# Patient Record
Sex: Male | Born: 1990 | Race: White | Hispanic: No | Marital: Single | Smoking: Current every day smoker
Health system: Southern US, Community
[De-identification: ages and names within clinical notes are randomized; demographics above are authoritative.]

## PROBLEM LIST (undated history)

## (undated) DIAGNOSIS — F410 Panic disorder [episodic paroxysmal anxiety] without agoraphobia: Secondary | ICD-10-CM

## (undated) DIAGNOSIS — Z8709 Personal history of other diseases of the respiratory system: Secondary | ICD-10-CM

## (undated) DIAGNOSIS — IMO0001 Reserved for inherently not codable concepts without codable children: Secondary | ICD-10-CM

## (undated) DIAGNOSIS — Z8701 Personal history of pneumonia (recurrent): Secondary | ICD-10-CM

## (undated) DIAGNOSIS — F419 Anxiety disorder, unspecified: Secondary | ICD-10-CM

## (undated) DIAGNOSIS — G43909 Migraine, unspecified, not intractable, without status migrainosus: Secondary | ICD-10-CM

## (undated) DIAGNOSIS — R51 Headache: Secondary | ICD-10-CM

## (undated) DIAGNOSIS — T07XXXA Unspecified multiple injuries, initial encounter: Secondary | ICD-10-CM

## (undated) DIAGNOSIS — G8929 Other chronic pain: Secondary | ICD-10-CM

## (undated) DIAGNOSIS — R519 Headache, unspecified: Secondary | ICD-10-CM

## (undated) DIAGNOSIS — M549 Dorsalgia, unspecified: Secondary | ICD-10-CM

## (undated) DIAGNOSIS — K219 Gastro-esophageal reflux disease without esophagitis: Secondary | ICD-10-CM

## (undated) HISTORY — PX: BACK SURGERY: SHX140

## (undated) HISTORY — PX: TONSILLECTOMY: SUR1361

## (undated) HISTORY — PX: TYMPANOSTOMY TUBE PLACEMENT: SHX32

## (undated) HISTORY — DX: Anxiety disorder, unspecified: F41.9

---
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
## 2011-02-15 LAB — URINALYSIS, ROUTINE W REFLEX MICROSCOPIC
Bilirubin Urine: NEGATIVE
Glucose, UA: NEGATIVE mg/dL
Hgb urine dipstick: NEGATIVE
Ketones, ur: NEGATIVE mg/dL
Protein, ur: NEGATIVE mg/dL

## 2011-02-15 LAB — COMPREHENSIVE METABOLIC PANEL
Albumin: 3.9 g/dL (ref 3.5–5.2)
BUN: 8 mg/dL (ref 6–23)
Chloride: 104 mEq/L (ref 96–112)
Creatinine, Ser: 1.13 mg/dL (ref 0.4–1.5)
Glucose, Bld: 110 mg/dL — ABNORMAL HIGH (ref 70–99)
Total Bilirubin: 0.7 mg/dL (ref 0.3–1.2)
Total Protein: 7 g/dL (ref 6.0–8.3)

## 2011-02-15 LAB — TSH: TSH: 0.615 u[IU]/mL (ref 0.350–4.500)

## 2011-02-15 LAB — DRUGS OF ABUSE SCREEN W/O ALC, ROUTINE URINE
Barbiturate Quant, Ur: NEGATIVE
Benzodiazepines.: NEGATIVE
Cocaine Metabolites: NEGATIVE
Methadone: NEGATIVE
Phencyclidine (PCP): NEGATIVE

## 2011-02-15 LAB — CBC
HCT: 39.4 % (ref 39.0–52.0)
Hemoglobin: 13.1 g/dL (ref 13.0–17.0)
MCV: 83.9 fL (ref 78.0–100.0)
Platelets: 275 10*3/uL (ref 150–400)
RDW: 15.1 % (ref 11.5–15.5)
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
## 2012-05-08 ENCOUNTER — Ambulatory Visit (INDEPENDENT_AMBULATORY_CARE_PROVIDER_SITE_OTHER): Payer: Managed Care, Other (non HMO) | Admitting: Family Medicine

## 2012-05-08 VITALS — BP 132/92 | HR 115 | Temp 98.7°F | Resp 18 | Ht 76.18 in | Wt 250.4 lb

## 2012-05-08 DIAGNOSIS — F410 Panic disorder [episodic paroxysmal anxiety] without agoraphobia: Secondary | ICD-10-CM

## 2012-05-08 DIAGNOSIS — G4700 Insomnia, unspecified: Secondary | ICD-10-CM

## 2012-05-08 DIAGNOSIS — E039 Hypothyroidism, unspecified: Secondary | ICD-10-CM

## 2012-05-08 MED ORDER — ZOLPIDEM TARTRATE 10 MG PO TABS: 10.0000 mg | ORAL_TABLET | Freq: Every evening | ORAL | Status: DC | PRN

## 2012-05-08 MED ORDER — ALPRAZOLAM 1 MG PO TABS: 1.0000 mg | ORAL_TABLET | Freq: Three times a day (TID) | ORAL | Status: DC | PRN

## 2012-05-08 NOTE — Progress Notes (Signed)
[facility]:  Office Visit  Chief Complaint:  Chief Complaint  Patient presents with  . Panic Attack    years,   has been seeing dr. Odette Fraction  last took klonopin 2mg   did not help    HPI: Daniel [name] is a 21 y.o. male who complains of: Anxiety attack that started last night, he does not know what triggered it. He has had anxiety issues for years. Patient was on Clonazepam 2 mg TID but last dose was 6 days ago. He has no more meds. He sees Dr. Otelia Santee. States that med does not help him with his anxiety. LAst saw Dr. Otelia Santee 2-3 months ago. He denies being suicidal or homicidal. He does have some paranoia, chronic issue, sonmeone is watching him. No hallucinations. He lives with his parents. He has a prior history of hypothyroid was on thyroid medicationbut stopped several years earlier. He was on growth hormones for several years as an adolescent.   Past Medical History  Diagnosis Date  . Anxiety    History reviewed. No pertinent past surgical history. History   Social History  . Marital Status: Single    Spouse Name: N/A    Number of Children: N/A  . Years of Education: N/A   Social History Main Topics  . Smoking status: Current Everyday Smoker  . Smokeless tobacco: None  . Alcohol Use: Yes  . Drug Use: No  . Sexually Active: None   Other Topics Concern  . None   Social History Narrative  . None   Family History  Problem Relation Age of Onset  . Hyperlipidemia Father    No Known Allergies Prior to Admission medications   Not on File     ROS: The patient denies fevers, chills, night sweats, unintentional weight loss, chest pain, palpitations, wheezing, dyspnea on exertion, nausea, vomiting, abdominal pain, dysuria, hematuria, melena, numbness, weakness, or tingling.  All other systems have been reviewed and were otherwise negative with the exception of those mentioned in the HPI and as above.    PHYSICAL EXAM: Filed Vitals:   05/08/12 1659  BP: 132/92  Pulse: 115  Temp: 98.7 F (37.1 C)  Resp: 18   Filed Vitals:   05/08/12 1659  Height: 6' 4.18" (1.935 m)  Weight: 250 lb 6.4 oz (113.581 kg)   Body mass index is 30.34 kg/(m^2).  General: Alert, anxious. Mouth twitching HEENT:  Normocephalic, atraumatic, oropharynx patent.  Cardiovascular:  Regular rate and rhythm, no rubs murmurs or gallops.  No Carotid bruits, radial pulse intact. No pedal edema.  Respiratory: Clear to auscultation bilaterally.  No wheezes, rales, or rhonchi.  No cyanosis, no use of accessory musculature GI: No organomegaly, abdomen is soft and non-tender, positive bowel sounds.  No masses. Skin: No rashes. Neurologic: Facial musculature symmetric. Psychiatric: Patient is appropriate throughout our interaction. Lymphatic: No cervical lymphadenopathy. Musculoskeletal: Gait intact.   LABS:    EKG/XRAY:   Primary read interpreted by Dr[facility] Conley Rolls at [facility].   ASSESSMENT/PLAN: Encounter Diagnoses  Name Primary?  Marland Kitchen Anxiety attack Yes  . Hypothyroidism     Attempted to call Dr. Otelia Santee to expedite appt. Will try in AM. I have advised patient to go to ER if having thoughts of SI/HI or worsening sxs.  Patient doe snot think that Clonazepam is helping him would like to try something new. He would like help with sleeping as well since has not slept in 6 hours.  Will prescribe Xanax 1 mg tablets  PO TID , #15 and Ambien 10 mg # 15  Patient will try to get back to Dr. Otelia Santee ASAP, no refills from [place]. Check TSH for prior h/o thyroid supplementation.     Daniel Capri PHUONG, DO 05/08/2012 5:35 PM

## 2012-05-09 ENCOUNTER — Telehealth: Payer: Self-pay | Admitting: Family Medicine

## 2012-05-09 LAB — TSH: TSH: 1.808 u[IU]/mL (ref 0.350–4.500)

## 2012-05-09 NOTE — Telephone Encounter (Signed)
Attempted to call patient unbale to leavemessage because voicemail not set up. Able to make appt for him with Dr. Odessa Fleming on Monday @ 2:25 pm. If he has any other problems before then he can call his nurse Mary/Dottie at [phone]. Daniel [name] is only in office on MTuesW, 1-4 pm. This was the best I could do.

## 2012-05-09 NOTE — Telephone Encounter (Signed)
Pt had tried to [facility] but didn't get to speak w/anyone. Called pt back and he reported that the xanax/ambien combo is working very well for him. It works but doesn't make him sleepy and he can still function. He would like to continue this but only has enough xanax for 5 days. He would rather not have to pay another co-pay for just a couple of days and then have Dr Otelia Santee Rx it and have to pay the co-pay again, even though it is not very exp. Can we go ahead and Rx the xanax for a month? He has enough Ambien to last until after he sees Dr Otelia Santee.

## 2012-05-10 ENCOUNTER — Telehealth: Payer: Self-pay | Admitting: Family Medicine

## 2012-05-10 ENCOUNTER — Other Ambulatory Visit: Payer: Self-pay | Admitting: Family Medicine

## 2012-05-10 DIAGNOSIS — F410 Panic disorder [episodic paroxysmal anxiety] without agoraphobia: Secondary | ICD-10-CM

## 2012-05-10 MED ORDER — ALPRAZOLAM 1 MG PO TABS: 1.0000 mg | ORAL_TABLET | Freq: Three times a day (TID) | ORAL | Status: AC | PRN

## 2012-05-10 NOTE — Telephone Encounter (Signed)
Spoke with Daniel [name] abouthis appt with Otelia Santee. He will go on MOnday. His combo meds of Xanax and Remus Loffler is working well. He is able to sleep and function without drowsiness.  I will give him only #15 more to cover up through Monday and a little beyond. He does not need ambien. He and I discussed the addictive properties of the medicine and he knows that our office will not refill his Xanax after this. He needs to go see Dr. Otelia Santee. Patient agrees.

## 2014-07-18 ENCOUNTER — Other Ambulatory Visit (HOSPITAL_COMMUNITY): Payer: Self-pay | Admitting: Sports Medicine

## 2014-07-18 DIAGNOSIS — S32010A Wedge compression fracture of first lumbar vertebra, initial encounter for closed fracture: Secondary | ICD-10-CM

## 2014-07-18 DIAGNOSIS — W19XXXA Unspecified fall, initial encounter: Secondary | ICD-10-CM

## 2014-07-18 DIAGNOSIS — S32030A Wedge compression fracture of third lumbar vertebra, initial encounter for closed fracture: Secondary | ICD-10-CM

## 2014-07-18 DIAGNOSIS — S32020A Wedge compression fracture of second lumbar vertebra, initial encounter for closed fracture: Secondary | ICD-10-CM

## 2014-07-23 ENCOUNTER — Other Ambulatory Visit: Payer: Self-pay | Admitting: Physical Medicine and Rehabilitation

## 2014-07-23 DIAGNOSIS — M545 Low back pain, unspecified: Secondary | ICD-10-CM

## 2014-07-24 ENCOUNTER — Ambulatory Visit (HOSPITAL_COMMUNITY): Admission: RE | Admit: 2014-07-24 | Payer: Managed Care, Other (non HMO) | Source: Ambulatory Visit

## 2014-08-02 ENCOUNTER — Ambulatory Visit
Admission: RE | Admit: 2014-08-02 | Discharge: 2014-08-02 | Disposition: A | Discharge status: Home / Self Care | Payer: Managed Care, Other (non HMO) | Source: Ambulatory Visit | Attending: Orthopedic Surgery | Admitting: Orthopedic Surgery

## 2014-08-02 ENCOUNTER — Other Ambulatory Visit: Payer: Self-pay | Admitting: Orthopedic Surgery

## 2014-08-02 DIAGNOSIS — S32000D Wedge compression fracture of unspecified lumbar vertebra, subsequent encounter for fracture with routine healing: Secondary | ICD-10-CM

## 2014-08-05 ENCOUNTER — Encounter (HOSPITAL_COMMUNITY): Payer: Self-pay | Admitting: General Practice

## 2014-08-05 ENCOUNTER — Inpatient Hospital Stay (HOSPITAL_COMMUNITY)
Admission: AD | Admit: 2014-08-05 | Discharge: 2014-08-09 | DRG: 460 | Disposition: A | Discharge status: Home / Self Care | Payer: Managed Care, Other (non HMO) | Source: Ambulatory Visit | Attending: Orthopedic Surgery | Admitting: Orthopedic Surgery

## 2014-08-05 DIAGNOSIS — Z9119 Patient's noncompliance with other medical treatment and regimen: Secondary | ICD-10-CM | POA: Diagnosis present

## 2014-08-05 DIAGNOSIS — F419 Anxiety disorder, unspecified: Secondary | ICD-10-CM | POA: Diagnosis present

## 2014-08-05 DIAGNOSIS — Z87891 Personal history of nicotine dependence: Secondary | ICD-10-CM

## 2014-08-05 DIAGNOSIS — S32028A Other fracture of second lumbar vertebra, initial encounter for closed fracture: Principal | ICD-10-CM | POA: Diagnosis present

## 2014-08-05 DIAGNOSIS — S32000A Wedge compression fracture of unspecified lumbar vertebra, initial encounter for closed fracture: Secondary | ICD-10-CM | POA: Diagnosis present

## 2014-08-05 DIAGNOSIS — M4326 Fusion of spine, lumbar region: Secondary | ICD-10-CM

## 2014-08-05 DIAGNOSIS — E780 Pure hypercholesterolemia: Secondary | ICD-10-CM | POA: Diagnosis present

## 2014-08-05 DIAGNOSIS — G8918 Other acute postprocedural pain: Secondary | ICD-10-CM | POA: Diagnosis not present

## 2014-08-05 DIAGNOSIS — IMO0002 Reserved for concepts with insufficient information to code with codable children: Secondary | ICD-10-CM

## 2014-08-05 DIAGNOSIS — S32038A Other fracture of third lumbar vertebra, initial encounter for closed fracture: Secondary | ICD-10-CM | POA: Diagnosis present

## 2014-08-05 DIAGNOSIS — W19XXXA Unspecified fall, initial encounter: Secondary | ICD-10-CM | POA: Diagnosis present

## 2014-08-05 DIAGNOSIS — M545 Low back pain: Secondary | ICD-10-CM | POA: Diagnosis present

## 2014-08-05 HISTORY — DX: Unspecified multiple injuries, initial encounter: T07.XXXA

## 2014-08-05 LAB — URINALYSIS, ROUTINE W REFLEX MICROSCOPIC
BILIRUBIN URINE: NEGATIVE
Glucose, UA: NEGATIVE mg/dL
Hgb urine dipstick: NEGATIVE
Ketones, ur: NEGATIVE mg/dL
Leukocytes, UA: NEGATIVE
NITRITE: NEGATIVE
PROTEIN: NEGATIVE mg/dL
SPECIFIC GRAVITY, URINE: 1.015 (ref 1.005–1.030)
UROBILINOGEN UA: 0.2 mg/dL (ref 0.0–1.0)
pH: 5.5 (ref 5.0–8.0)

## 2014-08-05 LAB — COMPREHENSIVE METABOLIC PANEL
ALBUMIN: 4.1 g/dL (ref 3.5–5.2)
ALK PHOS: 97 U/L (ref 39–117)
ALT: 17 U/L (ref 0–53)
AST: 44 U/L — ABNORMAL HIGH (ref 0–37)
Anion gap: 12 (ref 5–15)
BUN: 10 mg/dL (ref 6–23)
CO2: 26 mEq/L (ref 19–32)
CREATININE: 1.37 mg/dL — AB (ref 0.50–1.35)
Calcium: 9.1 mg/dL (ref 8.4–10.5)
Chloride: 101 mEq/L (ref 96–112)
GFR calc non Af Amer: 71 mL/min — ABNORMAL LOW (ref >90)
GFR, EST AFRICAN AMERICAN: 82 mL/min — AB (ref >90)
GLUCOSE: 88 mg/dL (ref 70–99)
Potassium: 4.1 mEq/L (ref 3.7–5.3)
Sodium: 139 mEq/L (ref 137–147)
TOTAL PROTEIN: 7.4 g/dL (ref 6.0–8.3)
Total Bilirubin: <0.2 mg/dL — ABNORMAL LOW (ref 0.3–1.2)

## 2014-08-05 LAB — RAPID URINE DRUG SCREEN, HOSP PERFORMED
Amphetamines: NOT DETECTED
Barbiturates: NOT DETECTED
Benzodiazepines: POSITIVE — AB
Cocaine: NOT DETECTED
Opiates: NOT DETECTED
Tetrahydrocannabinol: NOT DETECTED

## 2014-08-05 LAB — ETHANOL: Alcohol, Ethyl (B): <11 mg/dL (ref 0–11)

## 2014-08-05 LAB — CBC
HEMATOCRIT: 39.7 % (ref 39.0–52.0)
HEMOGLOBIN: 13.5 g/dL (ref 13.0–17.0)
MCH: 28.2 pg (ref 26.0–34.0)
MCHC: 34 g/dL (ref 30.0–36.0)
MCV: 83.1 fL (ref 78.0–100.0)
Platelets: 219 10*3/uL (ref 150–400)
RBC: 4.78 MIL/uL (ref 4.22–5.81)
RDW: 13.4 % (ref 11.5–15.5)
WBC: 8.7 10*3/uL (ref 4.0–10.5)

## 2014-08-05 LAB — PROTIME-INR
INR: 1.12 (ref 0.00–1.49)
Prothrombin Time: 14.4 seconds (ref 11.6–15.2)

## 2014-08-05 LAB — APTT: APTT: 35 s (ref 24–37)

## 2014-08-05 MED ORDER — DOCUSATE SODIUM 100 MG PO CAPS
100.0000 mg | ORAL_CAPSULE | Freq: Two times a day (BID) | ORAL | Status: DC
  Administered 2014-08-06 – 2014-08-09 (×5): 100 mg via ORAL
  Filled 2014-08-05 (×10): qty 1

## 2014-08-05 MED ORDER — POTASSIUM CHLORIDE IN NACL 20-0.45 MEQ/L-% IV SOLN
INTRAVENOUS | Status: DC
  Administered 2014-08-06 – 2014-08-08 (×2): via INTRAVENOUS
  Filled 2014-08-05 (×9): qty 1000

## 2014-08-05 MED ORDER — HYDROCODONE-ACETAMINOPHEN 10-325 MG PO TABS: 1.0000 | ORAL_TABLET | Freq: Four times a day (QID) | ORAL | Status: DC | PRN

## 2014-08-05 MED ORDER — INFLUENZA VAC SPLIT QUAD 0.5 ML IM SUSY
0.5000 mL | PREFILLED_SYRINGE | INTRAMUSCULAR | Status: DC
  Filled 2014-08-05: qty 0.5

## 2014-08-05 MED ORDER — SENNOSIDES-DOCUSATE SODIUM 8.6-50 MG PO TABS: 1.0000 | ORAL_TABLET | Freq: Every evening | ORAL | Status: DC | PRN

## 2014-08-05 MED ORDER — ALPRAZOLAM 0.5 MG PO TABS
2.0000 mg | ORAL_TABLET | Freq: Three times a day (TID) | ORAL | Status: DC
  Administered 2014-08-05 – 2014-08-09 (×11): 2 mg via ORAL
  Filled 2014-08-05 (×2): qty 8
  Filled 2014-08-05 (×9): qty 4

## 2014-08-05 MED ORDER — METHOCARBAMOL 500 MG PO TABS
500.0000 mg | ORAL_TABLET | Freq: Four times a day (QID) | ORAL | Status: DC | PRN
  Administered 2014-08-07 – 2014-08-09 (×5): 500 mg via ORAL
  Filled 2014-08-05 (×5): qty 1

## 2014-08-05 MED ORDER — CHLORPROMAZINE HCL 100 MG PO TABS
200.0000 mg | ORAL_TABLET | Freq: Every day | ORAL | Status: DC
  Administered 2014-08-05: 200 mg via ORAL
  Filled 2014-08-05 (×2): qty 2

## 2014-08-05 MED ORDER — ONDANSETRON HCL 4 MG/2ML IJ SOLN: 4.0000 mg | Freq: Four times a day (QID) | INTRAMUSCULAR | Status: DC | PRN

## 2014-08-05 MED ORDER — OXYCODONE-ACETAMINOPHEN 5-325 MG PO TABS
1.0000 | ORAL_TABLET | Freq: Once | ORAL | Status: AC
  Administered 2014-08-06: 1 via ORAL
  Filled 2014-08-05: qty 1

## 2014-08-05 MED ORDER — ONDANSETRON HCL 4 MG PO TABS
4.0000 mg | ORAL_TABLET | Freq: Four times a day (QID) | ORAL | Status: DC | PRN
  Administered 2014-08-09: 4 mg via ORAL
  Filled 2014-08-05: qty 1

## 2014-08-05 MED ORDER — QUETIAPINE FUMARATE 400 MG PO TABS
1000.0000 mg | ORAL_TABLET | Freq: Every day | ORAL | Status: DC
  Administered 2014-08-05 – 2014-08-08 (×4): 1000 mg via ORAL
  Filled 2014-08-05 (×5): qty 1

## 2014-08-05 NOTE — H&P (Signed)
History of Present Illness   The patient is a 23 year old male who presents today for follow up of their back. The patient is being followed for their lumbar compression fracture. Symptoms reported today include: pain and pain at night. The patient states that they are doing poorly. Current treatment includes: relative rest, pain medications and brace. The following medication has been used for pain control: Percocet ("it does not help" states he is taking 3 per day). The patient reports their current pain level to be 10 / 10. The patient presents today following CT scan (lumbar @ [facility]). Note for "Follow-up back": The patient does not have his brace or sling on today. The patient states he has not been in pain management for several months. The patient's sister is here today and states she is concerned with amount of meds he is taking. The patient does demonstrate slurred speach and balance issues.    The patient returns today for follow up. The patient was seen 07/22/14. He was actually doing quite well. He was still requiring pain medication and at that point he was three and a half weeks out from his fall with the L2-3 compression fractures. That was his first visit with me. At that point in time I ordered a TLSO brace to be fitted for his fracture. He subsequently got the brace and he was stable in the brace. However, on 08/01/14 he returned to my office at that time with about a four day history of significant increase in his back pain. He denied incontinence of bowel and bladder, weakness, or numbness in the legs, but his back pain was horrific. As a result a CT scan was ordered to reevaluate the back. He was also given a prescription for Percocet 10/325. Since that time he has had some obvious altered mental status. He is a little confused and disoriented. I spoke with his sister and his mother, it is unclear as to how much of the Percocet he has been taking. At this point in time he  is in my office to review the CT scan.   Social History  Tobacco use Former smoker. 09/18/2013 Number of flights of stairs before winded 4-5 Tobacco / smoke exposure 09/18/2013: no Children 0 Current drinker 09/18/2013: Currently drinks beer only occasionally per week Current work status working full time No history of drug/alcohol rehab Not under pain contract Marital status single Exercise Exercises weekly; does running / walking and gym / weights Living situation live with parents  Medication History  Percocet (10-325MG  Tablet, 1 (one) Oral three times daily, as needed, Taken starting 08/01/2014) Active. Meloxicam (15MG  Tablet, 1 (one) Tablet Oral 1 daily prn, Taken starting 07/22/2014) Active. Robaxin (500MG  Tablet, 1 (one) Oral three times daily, as needed, Taken starting 08/01/2014) Active. ALPRAZolam (1MG  Tablet, Oral) Active. (as needed) QUEtiapine Fumarate (200MG  Tablet, Oral) Active. Thorazine (Oral) Specific dose unknown - Active. Medications Reconciled  Other Problems Mickel Duhamel Young; 08/05/2014 1:18 PM) Anxiety Disorder Hypercholesterolemia    Objective Transcription  On clinical exam he is a pleasant gentleman. He is confused, but he does recognize me and he does recognize where he is, but he is somewhat confused. He has no tongue deviation. No asymmetrical facial droop. He has 5/5 strength in upper extremity and lower extremity. He does have pain in the back when I test his hip flexors, but he has no radicular symptoms in the legs. Negative Babinski test. No clonus. Symmetrical 2+ deep tendon reflexes in the lower  extremity. He has horrific back pain with palpation and range of motion. No obvious skin lesions, abrasion, or contusions. Compartments are soft and nontender.  RADIOGRAPHS: At this point in time I did review the CT scan. There is increased local kyphosis and increased loss of anterior height of both the L2 and L3 compression  fractures. The central split in L3 with disruption of both the superior and inferior endplates persists.    Compression fracture (T14.8)  Plans Transcription  At this point in time because of the increase in pain despite being in a TSLO, I have recommended that we admit him for surgical treatment of his back.  Unfortunately, he has not been 100% compliant with the TLSO, which has contributed to the increased deformity and now the knee for surgical intervention. Fortunately, he is neurologically intact and there is no evidence that there has been compromise to the foramen or the nerves themselves.  At this point I would recommend doing a kyphoplasty at L2 since the endplates are intact and then doing a spanning internal fixation with pedicle screw fixation at L1 and L4. This would allow for internal bracing and improved pain control while he heals. Then at about six months I would remove the hardware. He is only 23 years old, I do not think he will do well with a long term fusion. If we can stabilize his back so that he can heal, he should recover. The kyphoplasty will allow us to control his pain better by at least treating the L2 fracture more immediately. I have discussed this with his sister. The risks of that include infection, bleeding, nerve damage, death, stoke, paralysis, failure to heal, need for further surgery, ongoing or worse pain, loss of bowel and bladder control, blood clots, adjacent segment disease, and leak of cement. At this point I think this is our best option. I will be admitting him to the hospital today and I will try to speak with his parents either tonight or tomorrow night and then we will plan on surgery Wednesday afternoon. There is no evidence of cauda equina syndrome as he has intact bowel and bladder control and there are no dysesthesias in those areas.

## 2014-08-05 NOTE — Progress Notes (Signed)
Patient was admitted to the hospital as a direct admit from the office, approximately at 15:00 today. Patient refused to take ordered PRN pain medicine, Hydrocodone and Robaxin, even though he was in extreme pain. He stated that he had an allergy to Hydrocodone. Daniel NonesJames PA-C was notified, however, the patient was given a prescription for Hydrocodone 1.5 weeks ago and this was the first word the providers had been given about an allergy. I asked the patient what he did with his prescription for hydrocodone. He stated that he took one pill, and it started making him itch a lot and made his mouth feel funny "like it was closing", he then took the prescription to [facility] to see if their medication would have the same results.... ??? The patient said that he had the same affects from the other pharmacy's drug. He requested to have OxyCodone and a Fentanyl patch ordered. I spoke to AlvaradoOwens again, who said he was consulting with the physician, Dr. Shon BatonBrooks. The patient refused to void for a urine sample. He also had stange behavior while the IV was being inserted, as he fell asleep briefly. Apparently, the patient had some previous trouble and psych issues while taking OxyCodone, according to the doctor/Pa. I asked the patient if he ever experienced strange thoughts while taking Oxycodone, and he informed me that was a "lie". He said if he didn't get oxycodone, he would leave the hospital AMA to go home and get his own supply of oxycodone. He said his parents would take him home. During the last four hours of my shift, I spoke with Dr. Shon BatonBrooks and Daniel NonesJames PA-C ten times. Upon giving report to the night RN, Dr, Shon BatonBrooks said he would be making rounds later. I informed the patient he had to provide a urine sample and allow for his blood to be drawn in order for Dr. Shon BatonBrooks to be able to care for him adequately. I also encouraged him to stay in the hospital where we could give him good care and the ortho team would help him recover. I  shared my thoughts with him that if he left AMA, Dr. Shon BatonBrooks may not be able to continue as his provider, so it would be most benefical to stay and discuss his plan of care with the doctor.

## 2014-08-06 MED ORDER — OXYCODONE-ACETAMINOPHEN 5-325 MG PO TABS: 1.0000 | ORAL_TABLET | Freq: Four times a day (QID) | ORAL | Status: DC | PRN

## 2014-08-06 MED ORDER — DIPHENHYDRAMINE HCL 25 MG PO CAPS
50.0000 mg | ORAL_CAPSULE | Freq: Every evening | ORAL | Status: DC | PRN
  Administered 2014-08-08: 50 mg via ORAL
  Filled 2014-08-06: qty 2

## 2014-08-06 MED ORDER — OXYCODONE-ACETAMINOPHEN 5-325 MG PO TABS
1.0000 | ORAL_TABLET | Freq: Four times a day (QID) | ORAL | Status: DC | PRN
  Administered 2014-08-06 – 2014-08-07 (×4): 2 via ORAL
  Filled 2014-08-06 (×3): qty 2

## 2014-08-06 NOTE — Progress Notes (Signed)
Subjective: C/o pain.  RN states that patient has been up ambulating in room and halls with and without brace.     Objective: Vital signs in last 24 hours: Temp:  [98 F (36.7 C)-98.6 F (37 C)] 98 F (36.7 C) (10/05 2034) Pulse Rate:  [86-117] 86 (10/05 2034) Resp:  [22] 22 (10/05 1608) BP: (135-144)/(88-96) 135/88 mmHg (10/05 2034) SpO2:  [100 %] 100 % (10/05 2034)  Intake/Output from previous day: 10/05 0701 - 10/06 0700 In: 720 [P.O.:720] Out: 700 [Urine:700] Intake/Output this shift: Total I/O In: 240 [P.O.:240] Out: -    Recent Labs  08/05/14 1705  HGB 13.5    Recent Labs  08/05/14 1705  WBC 8.7  RBC 4.78  HCT 39.7  PLT 219    Recent Labs  08/05/14 1705  NA 139  K 4.1  CL 101  CO2 26  BUN 10  CREATININE 1.37*  GLUCOSE 88  CALCIUM 9.1    Recent Labs  08/05/14 1705  INR 1.12    Exam: Brace on at time of visit.  He is neurologically intact.     Assessment/Plan: Will start percocet 5/325 po q6hrs prn pain.  Advised patient that he must be compliant with wearing his brace.  Explained the risks of further compression of his fracture, spinal cord injury, paralysis, etc.  Will plan to go to OR tomorrow as scheduled.     Guy Toney M 08/06/2014, 1:25 PM

## 2014-08-07 ENCOUNTER — Observation Stay (HOSPITAL_COMMUNITY): Payer: Managed Care, Other (non HMO)

## 2014-08-07 ENCOUNTER — Encounter (HOSPITAL_COMMUNITY): Payer: Self-pay | Admitting: Anesthesiology

## 2014-08-07 ENCOUNTER — Encounter (HOSPITAL_COMMUNITY)
Admission: AD | Disposition: A | Discharge status: Home / Self Care | Payer: Self-pay | Source: Ambulatory Visit | Attending: Orthopedic Surgery

## 2014-08-07 ENCOUNTER — Encounter (HOSPITAL_COMMUNITY): Payer: Managed Care, Other (non HMO) | Admitting: Anesthesiology

## 2014-08-07 ENCOUNTER — Observation Stay (HOSPITAL_COMMUNITY): Payer: Managed Care, Other (non HMO) | Admitting: Anesthesiology

## 2014-08-07 DIAGNOSIS — S32000A Wedge compression fracture of unspecified lumbar vertebra, initial encounter for closed fracture: Secondary | ICD-10-CM | POA: Diagnosis present

## 2014-08-07 HISTORY — PX: KYPHOPLASTY: SHX5884

## 2014-08-07 LAB — GLUCOSE, CAPILLARY: Glucose-Capillary: 99 mg/dL (ref 70–99)

## 2014-08-07 LAB — TYPE AND SCREEN
ABO/RH(D): O NEG
ANTIBODY SCREEN: NEGATIVE

## 2014-08-07 LAB — SURGICAL PCR SCREEN
MRSA, PCR: NEGATIVE
STAPHYLOCOCCUS AUREUS: NEGATIVE

## 2014-08-07 LAB — ABO/RH: ABO/RH(D): O NEG

## 2014-08-07 SURGERY — KYPHOPLASTY
Anesthesia: General | Site: Back

## 2014-08-07 MED ORDER — OXYCODONE-ACETAMINOPHEN 5-325 MG PO TABS
ORAL_TABLET | ORAL | Status: AC
  Administered 2014-08-07: 2 via ORAL
  Filled 2014-08-07: qty 2

## 2014-08-07 MED ORDER — SUFENTANIL CITRATE 50 MCG/ML IV SOLN
INTRAVENOUS | Status: AC
  Filled 2014-08-07: qty 1

## 2014-08-07 MED ORDER — FENTANYL CITRATE 0.05 MG/ML IJ SOLN
INTRAMUSCULAR | Status: DC | PRN
  Administered 2014-08-07: 100 ug via INTRAVENOUS
  Administered 2014-08-07: 150 ug via INTRAVENOUS

## 2014-08-07 MED ORDER — GLYCOPYRROLATE 0.2 MG/ML IJ SOLN
INTRAMUSCULAR | Status: AC
  Filled 2014-08-07: qty 2

## 2014-08-07 MED ORDER — HYDROMORPHONE HCL 1 MG/ML IJ SOLN
INTRAMUSCULAR | Status: AC
  Administered 2014-08-07: 0.5 mg via INTRAVENOUS
  Filled 2014-08-07: qty 1

## 2014-08-07 MED ORDER — ONDANSETRON HCL 4 MG/2ML IJ SOLN
INTRAMUSCULAR | Status: DC | PRN
  Administered 2014-08-07: 4 mg via INTRAVENOUS

## 2014-08-07 MED ORDER — HYDROMORPHONE HCL 1 MG/ML IJ SOLN
0.2500 mg | INTRAMUSCULAR | Status: DC | PRN
  Administered 2014-08-07 (×4): 0.5 mg via INTRAVENOUS

## 2014-08-07 MED ORDER — ONDANSETRON HCL 4 MG/2ML IJ SOLN
INTRAMUSCULAR | Status: AC
  Filled 2014-08-07: qty 2

## 2014-08-07 MED ORDER — DEXAMETHASONE SODIUM PHOSPHATE 4 MG/ML IJ SOLN
4.0000 mg | Freq: Four times a day (QID) | INTRAMUSCULAR | Status: DC
  Administered 2014-08-08 (×4): 4 mg via INTRAVENOUS
  Filled 2014-08-07 (×11): qty 1

## 2014-08-07 MED ORDER — CEFAZOLIN SODIUM 1-5 GM-% IV SOLN
1.0000 g | Freq: Three times a day (TID) | INTRAVENOUS | Status: AC
  Administered 2014-08-07 – 2014-08-08 (×2): 1 g via INTRAVENOUS
  Filled 2014-08-07 (×2): qty 50

## 2014-08-07 MED ORDER — METHOCARBAMOL 500 MG PO TABS
ORAL_TABLET | ORAL | Status: AC
  Administered 2014-08-07: 500 mg via ORAL
  Filled 2014-08-07: qty 1

## 2014-08-07 MED ORDER — THROMBIN 20000 UNITS EX SOLR
OROMUCOSAL | Status: DC | PRN
  Administered 2014-08-07: 14:00:00 via TOPICAL

## 2014-08-07 MED ORDER — DOCUSATE SODIUM 100 MG PO CAPS: 100.0000 mg | ORAL_CAPSULE | Freq: Two times a day (BID) | ORAL | Status: DC

## 2014-08-07 MED ORDER — LIDOCAINE HCL (CARDIAC) 20 MG/ML IV SOLN
INTRAVENOUS | Status: DC | PRN
  Administered 2014-08-07: 80 mg via INTRAVENOUS

## 2014-08-07 MED ORDER — OXYCODONE HCL 5 MG PO TABS
10.0000 mg | ORAL_TABLET | ORAL | Status: DC | PRN
  Administered 2014-08-07 – 2014-08-09 (×11): 10 mg via ORAL
  Filled 2014-08-07 (×11): qty 2

## 2014-08-07 MED ORDER — SODIUM CHLORIDE 0.9 % IJ SOLN: 9.0000 mL | INTRAMUSCULAR | Status: DC | PRN

## 2014-08-07 MED ORDER — THROMBIN 20000 UNITS EX SOLR
CUTANEOUS | Status: AC
  Filled 2014-08-07: qty 20000

## 2014-08-07 MED ORDER — ROCURONIUM BROMIDE 50 MG/5ML IV SOLN
INTRAVENOUS | Status: AC
  Filled 2014-08-07: qty 1

## 2014-08-07 MED ORDER — HYDROMORPHONE HCL 1 MG/ML IJ SOLN
INTRAMUSCULAR | Status: AC
  Filled 2014-08-07: qty 1

## 2014-08-07 MED ORDER — KETAMINE HCL 100 MG/ML IJ SOLN
INTRAMUSCULAR | Status: AC
  Filled 2014-08-07: qty 1

## 2014-08-07 MED ORDER — MENTHOL 3 MG MT LOZG: 1.0000 | LOZENGE | OROMUCOSAL | Status: DC | PRN

## 2014-08-07 MED ORDER — ARTIFICIAL TEARS OP OINT
TOPICAL_OINTMENT | OPHTHALMIC | Status: AC
  Filled 2014-08-07: qty 3.5

## 2014-08-07 MED ORDER — CEFAZOLIN SODIUM-DEXTROSE 2-3 GM-% IV SOLR
INTRAVENOUS | Status: AC
  Filled 2014-08-07: qty 50

## 2014-08-07 MED ORDER — LACTATED RINGERS IV SOLN
INTRAVENOUS | Status: DC | PRN
  Administered 2014-08-07 (×2): via INTRAVENOUS

## 2014-08-07 MED ORDER — ONDANSETRON HCL 4 MG/2ML IJ SOLN: 4.0000 mg | Freq: Four times a day (QID) | INTRAMUSCULAR | Status: DC | PRN

## 2014-08-07 MED ORDER — PROPOFOL 10 MG/ML IV BOLUS
INTRAVENOUS | Status: AC
  Filled 2014-08-07: qty 20

## 2014-08-07 MED ORDER — FENTANYL CITRATE 0.05 MG/ML IJ SOLN
INTRAMUSCULAR | Status: AC
  Filled 2014-08-07: qty 5

## 2014-08-07 MED ORDER — ACETAMINOPHEN 10 MG/ML IV SOLN
1000.0000 mg | Freq: Four times a day (QID) | INTRAVENOUS | Status: AC
  Administered 2014-08-08 (×3): 1000 mg via INTRAVENOUS
  Filled 2014-08-07 (×2): qty 100

## 2014-08-07 MED ORDER — LACTATED RINGERS IV SOLN
INTRAVENOUS | Status: DC
  Administered 2014-08-07: 13:00:00 via INTRAVENOUS

## 2014-08-07 MED ORDER — IOHEXOL 300 MG/ML  SOLN
INTRAMUSCULAR | Status: DC | PRN
  Administered 2014-08-07: 10 mL

## 2014-08-07 MED ORDER — OXYCODONE HCL 5 MG/5ML PO SOLN
5.0000 mg | Freq: Once | ORAL | Status: DC | PRN
Start: 2014-08-07 — End: 2014-08-07

## 2014-08-07 MED ORDER — LACTATED RINGERS IV SOLN: INTRAVENOUS | Status: DC

## 2014-08-07 MED ORDER — OXYCODONE HCL 5 MG PO TABS: 5.0000 mg | ORAL_TABLET | Freq: Once | ORAL | Status: DC | PRN

## 2014-08-07 MED ORDER — QUETIAPINE FUMARATE 400 MG PO TABS: 1000.0000 mg | ORAL_TABLET | Freq: Every day | ORAL | Status: DC

## 2014-08-07 MED ORDER — BUPIVACAINE-EPINEPHRINE (PF) 0.25% -1:200000 IJ SOLN
INTRAMUSCULAR | Status: AC
  Filled 2014-08-07: qty 30

## 2014-08-07 MED ORDER — NALOXONE HCL 0.4 MG/ML IJ SOLN: 0.4000 mg | INTRAMUSCULAR | Status: DC | PRN

## 2014-08-07 MED ORDER — SODIUM CHLORIDE 0.9 % IJ SOLN: 3.0000 mL | Freq: Two times a day (BID) | INTRAMUSCULAR | Status: DC

## 2014-08-07 MED ORDER — DIPHENHYDRAMINE HCL 50 MG/ML IJ SOLN: 12.5000 mg | Freq: Four times a day (QID) | INTRAMUSCULAR | Status: DC | PRN

## 2014-08-07 MED ORDER — MIDAZOLAM HCL 2 MG/2ML IJ SOLN
INTRAMUSCULAR | Status: AC
  Filled 2014-08-07: qty 2

## 2014-08-07 MED ORDER — DIPHENHYDRAMINE HCL 12.5 MG/5ML PO ELIX: 12.5000 mg | ORAL_SOLUTION | Freq: Four times a day (QID) | ORAL | Status: DC | PRN

## 2014-08-07 MED ORDER — DEXAMETHASONE SODIUM PHOSPHATE 10 MG/ML IJ SOLN
INTRAMUSCULAR | Status: DC | PRN
  Administered 2014-08-07: 10 mg via INTRAVENOUS

## 2014-08-07 MED ORDER — MIDAZOLAM HCL 5 MG/5ML IJ SOLN
INTRAMUSCULAR | Status: DC | PRN
  Administered 2014-08-07: 2 mg via INTRAVENOUS

## 2014-08-07 MED ORDER — SUCCINYLCHOLINE CHLORIDE 20 MG/ML IJ SOLN
INTRAMUSCULAR | Status: DC | PRN
  Administered 2014-08-07: 80 mg via INTRAVENOUS

## 2014-08-07 MED ORDER — HYDROMORPHONE HCL 1 MG/ML IJ SOLN
0.5000 mg | Freq: Four times a day (QID) | INTRAMUSCULAR | Status: DC | PRN
  Administered 2014-08-07 – 2014-08-09 (×7): 0.5 mg via INTRAVENOUS
  Filled 2014-08-07 (×6): qty 1

## 2014-08-07 MED ORDER — SODIUM CHLORIDE 0.9 % IV SOLN
500.0000 mg | INTRAVENOUS | Status: DC | PRN
  Administered 2014-08-07: 10 ug/kg/min via INTRAVENOUS

## 2014-08-07 MED ORDER — PROPOFOL 10 MG/ML IV BOLUS
INTRAVENOUS | Status: DC | PRN
  Administered 2014-08-07: 100 mg via INTRAVENOUS
  Administered 2014-08-07: 50 mg via INTRAVENOUS
  Administered 2014-08-07: 200 mg via INTRAVENOUS
  Administered 2014-08-07: 50 mg via INTRAVENOUS

## 2014-08-07 MED ORDER — MAGNESIUM CITRATE PO SOLN: 1.0000 | Freq: Once | ORAL | Status: AC | PRN

## 2014-08-07 MED ORDER — CEFAZOLIN SODIUM-DEXTROSE 2-3 GM-% IV SOLR
INTRAVENOUS | Status: DC | PRN
  Administered 2014-08-07: 2 g via INTRAVENOUS

## 2014-08-07 MED ORDER — SODIUM CHLORIDE 0.9 % IV SOLN: 250.0000 mL | INTRAVENOUS | Status: DC

## 2014-08-07 MED ORDER — BUPIVACAINE-EPINEPHRINE 0.25% -1:200000 IJ SOLN
INTRAMUSCULAR | Status: DC | PRN
  Administered 2014-08-07: 10 mL

## 2014-08-07 MED ORDER — NEOSTIGMINE METHYLSULFATE 10 MG/10ML IV SOLN
INTRAVENOUS | Status: AC
  Filled 2014-08-07: qty 1

## 2014-08-07 MED ORDER — ACETAMINOPHEN 10 MG/ML IV SOLN
INTRAVENOUS | Status: AC
  Filled 2014-08-07: qty 100

## 2014-08-07 MED ORDER — ALPRAZOLAM 0.5 MG PO TABS: 2.0000 mg | ORAL_TABLET | Freq: Three times a day (TID) | ORAL | Status: DC

## 2014-08-07 MED ORDER — SUFENTANIL CITRATE 250 MCG/5ML IV SOLN
250.0000 ug | INTRAVENOUS | Status: DC | PRN
  Administered 2014-08-07: .3 ug/kg/h via INTRAVENOUS

## 2014-08-07 MED ORDER — DEXAMETHASONE 4 MG PO TABS
4.0000 mg | ORAL_TABLET | Freq: Four times a day (QID) | ORAL | Status: DC
  Administered 2014-08-09 (×3): 4 mg via ORAL
  Filled 2014-08-07 (×11): qty 1

## 2014-08-07 MED ORDER — SODIUM CHLORIDE 0.9 % IJ SOLN: 3.0000 mL | INTRAMUSCULAR | Status: DC | PRN

## 2014-08-07 MED ORDER — ONDANSETRON HCL 4 MG/2ML IJ SOLN: 4.0000 mg | INTRAMUSCULAR | Status: DC | PRN

## 2014-08-07 MED ORDER — PHENOL 1.4 % MT LIQD: 1.0000 | OROMUCOSAL | Status: DC | PRN

## 2014-08-07 MED ORDER — ACETAMINOPHEN 10 MG/ML IV SOLN
INTRAVENOUS | Status: DC | PRN
  Administered 2014-08-07: 1000 mg via INTRAVENOUS

## 2014-08-07 SURGICAL SUPPLY — 52 items
BANDAGE ADH SHEER 1  50/CT (GAUZE/BANDAGES/DRESSINGS) ×3 IMPLANT
BLADE SURG 15 STRL LF DISP TIS (BLADE) ×1 IMPLANT
BLADE SURG 15 STRL SS (BLADE) ×2
CEMENT BONE KYPHX HV R (Orthopedic Implant) ×3 IMPLANT
CEMENT KYPHON C01A KIT/MIXER (Cement) ×6 IMPLANT
CLIP NEUROVISION LG (CLIP) ×3 IMPLANT
CLOSURE STERI-STRIP 1/2X4 (GAUZE/BANDAGES/DRESSINGS) ×1
CLSR STERI-STRIP ANTIMIC 1/2X4 (GAUZE/BANDAGES/DRESSINGS) ×2 IMPLANT
COVER MAYO STAND STRL (DRAPES) ×3 IMPLANT
CURETTE WEDGE 8.5MM KYPHX (MISCELLANEOUS) IMPLANT
DRAPE C-ARM 42X72 X-RAY (DRAPES) ×6 IMPLANT
DRAPE INCISE IOBAN 66X45 STRL (DRAPES) ×3 IMPLANT
DRAPE LAPAROTOMY T 102X78X121 (DRAPES) ×3 IMPLANT
DRAPE PROXIMA HALF (DRAPES) ×6 IMPLANT
DRSG MEPILEX BORDER 4X8 (GAUZE/BANDAGES/DRESSINGS) ×3 IMPLANT
DURAPREP 26ML APPLICATOR (WOUND CARE) ×3 IMPLANT
ELECT PENCIL ROCKER SW 15FT (MISCELLANEOUS) ×3 IMPLANT
GAUZE SPONGE 4X4 16PLY XRAY LF (GAUZE/BANDAGES/DRESSINGS) ×3 IMPLANT
GLOVE BIOGEL PI IND STRL 8 (GLOVE) ×1 IMPLANT
GLOVE BIOGEL PI INDICATOR 8 (GLOVE) ×2
GLOVE ECLIPSE 8.5 STRL (GLOVE) ×6 IMPLANT
GLOVE ORTHO TXT STRL SZ7.5 (GLOVE) ×3 IMPLANT
GOWN STRL REUS W/ TWL LRG LVL3 (GOWN DISPOSABLE) ×1 IMPLANT
GOWN STRL REUS W/TWL 2XL LVL3 (GOWN DISPOSABLE) ×6 IMPLANT
GOWN STRL REUS W/TWL LRG LVL3 (GOWN DISPOSABLE) ×2
GUIDEWIRE NITINOL BEVEL TIP (WIRE) ×12 IMPLANT
KIT BASIN OR (CUSTOM PROCEDURE TRAY) ×3 IMPLANT
KIT NEEDLE NVM5 EMG ELECT (KITS) ×1 IMPLANT
KIT NEEDLE NVM5 EMG ELECTRODE (KITS) ×2
KIT ROOM TURNOVER OR (KITS) ×3 IMPLANT
NEEDLE HYPO 25X1 1.5 SAFETY (NEEDLE) ×3 IMPLANT
NEEDLE I-PASS III (NEEDLE) ×3 IMPLANT
NEEDLE SPNL 18GX3.5 QUINCKE PK (NEEDLE) ×6 IMPLANT
NS IRRIG 1000ML POUR BTL (IV SOLUTION) ×3 IMPLANT
PACK SURGICAL SETUP 50X90 (CUSTOM PROCEDURE TRAY) ×3 IMPLANT
PACK UNIVERSAL I (CUSTOM PROCEDURE TRAY) ×3 IMPLANT
PAD ARMBOARD 7.5X6 YLW CONV (MISCELLANEOUS) ×6 IMPLANT
PROBE BALL TIP NVM5 SNG USE (BALLOONS) ×3 IMPLANT
ROD RELINE MAS LORD 5.5X110MM (Rod) ×3 IMPLANT
ROD RELINE MAS LORD 5.5X120MM (Rod) ×3 IMPLANT
SCREW LOCK RELINE 5.5 TULIP (Screw) ×12 IMPLANT
SCREW MAS RELINE 6.5X45 POLY (Screw) ×12 IMPLANT
SURGIFLO TRUKIT (HEMOSTASIS) IMPLANT
SUT BONE WAX W31G (SUTURE) ×3 IMPLANT
SUT MON AB 3-0 SH 27 (SUTURE) ×2
SUT MON AB 3-0 SH27 (SUTURE) ×1 IMPLANT
SYR CONTROL 10ML LL (SYRINGE) ×3 IMPLANT
TOWEL OR 17X24 6PK STRL BLUE (TOWEL DISPOSABLE) ×3 IMPLANT
TOWEL OR 17X26 10 PK STRL BLUE (TOWEL DISPOSABLE) ×3 IMPLANT
TRAY KYPHOPAK 15/3 ONESTEP 1ST (MISCELLANEOUS) IMPLANT
TRAY KYPHOPAK 20/3 ONESTEP 1ST (MISCELLANEOUS) ×6 IMPLANT
WATER STERILE IRR 1000ML POUR (IV SOLUTION) ×3 IMPLANT

## 2014-08-07 NOTE — Progress Notes (Signed)
Utilization review completed.  

## 2014-08-07 NOTE — Brief Op Note (Signed)
08/05/2014 - 08/07/2014  3:31 PM  PATIENT:  Daniel CockayneWilliam Hunter [name]  23 y.o. male  PRE-OPERATIVE DIAGNOSIS:  L2 & L3 Compression Fractures  POST-OPERATIVE DIAGNOSIS:  L2 & L3 Compression Fractures  PROCEDURE:  Procedure(s): L1 - L4 INSTRUMENTED FUSION AND L2 KYPHOPLASTY (N/A)  SURGEON:  Surgeon(s) and Role:    * Venita Lickahari Ivah Girardot, MD - Primary  PHYSICIAN ASSISTANT:   ASSISTANTS: Zonia KiefJames Owens   ANESTHESIA:   general  EBL:  Total I/O In: 1375 [I.V.:1375] Out: 800 [Urine:750; Blood:50]  BLOOD ADMINISTERED:none  DRAINS: none   LOCAL MEDICATIONS USED:  MARCAINE     SPECIMEN:  No Specimen  DISPOSITION OF SPECIMEN:  N/A  COUNTS:  YES  TOURNIQUET:  * No tourniquets in log *  DICTATION: .Other Dictation: Dictation Number [id number]  PLAN OF CARE: Admit to inpatient   PATIENT DISPOSITION:  PACU - hemodynamically stable.

## 2014-08-07 NOTE — Anesthesia Preprocedure Evaluation (Addendum)
Anesthesia Evaluation  Patient identified by MRN, date of birth, ID band Patient awake    Reviewed: Allergy & Precautions, H&P , NPO status , Patient's Chart, lab work & pertinent test results  History of Anesthesia Complications (+) history of anesthetic complications  Airway Mallampati: II TM Distance: >3 FB Neck ROM: Full    Dental  (+) Teeth Intact   Pulmonary neg shortness of breath, neg sleep apnea, neg COPDCurrent Smoker,  breath sounds clear to auscultation  Pulmonary exam normal       Cardiovascular negative cardio ROS  Rhythm:Regular     Neuro/Psych Anxiety L2-3 compression fractures    GI/Hepatic negative GI ROS, Neg liver ROS,   Endo/Other  negative endocrine ROS  Renal/GU negative Renal ROS     Musculoskeletal   Abdominal Normal abdominal exam  (+)   Peds  Hematology negative hematology ROS (+)   Anesthesia Other Findings   Reproductive/Obstetrics                          Anesthesia Physical Anesthesia Plan  ASA: II  Anesthesia Plan: General   Post-op Pain Management:    Induction: Intravenous  Airway Management Planned: Oral ETT  Additional Equipment: None  Intra-op Plan:   Post-operative Plan: Extubation in OR  Informed Consent: I have reviewed the patients History and Physical, chart, labs and discussed the procedure including the risks, benefits and alternatives for the proposed anesthesia with the patient or authorized representative who has indicated his/her understanding and acceptance.   Dental advisory given  Plan Discussed with: CRNA, Anesthesiologist and Surgeon  Anesthesia Plan Comments:        Anesthesia Quick Evaluation

## 2014-08-07 NOTE — Progress Notes (Signed)
Pt takes multiple narcotics and psych meds. Pt is sleeping soundly most of the time, and is lethargic when awake. Complains of pain when aroused, but goes right back to sleep. There is concern that the patient's respiratory status will decline w/ use of PCA. Dr. Shon BatonBrooks aware. Fentanyl PCA d/c'd. See new PRN dilaudid order.

## 2014-08-07 NOTE — Transfer of Care (Signed)
Immediate Anesthesia Transfer of Care Note  Patient: Daniel CockayneWilliam Hunter [name]  Procedure(s) Performed: Procedure(s): L1 - L4 INSTRUMENTED FUSION AND L2 KYPHOPLASTY (N/A)  Patient Location: PACU  Anesthesia Type:General  Level of Consciousness: sedated and patient cooperative  Airway & Oxygen Therapy: Patient Spontanous Breathing and Patient connected to face mask oxygen  Post-op Assessment: Report given to PACU RN and Post -op Vital signs reviewed and stable  Post vital signs: Reviewed and stable  Complications: No apparent anesthesia complications

## 2014-08-08 ENCOUNTER — Inpatient Hospital Stay (HOSPITAL_COMMUNITY): Payer: Managed Care, Other (non HMO)

## 2014-08-08 LAB — DRUG SCREEN PANEL (SERUM)

## 2014-08-08 MED ORDER — MAGNESIUM CITRATE PO SOLN
0.5000 | Freq: Once | ORAL | Status: AC
  Administered 2014-08-08: 0.5 via ORAL
  Filled 2014-08-08: qty 296

## 2014-08-08 MED ORDER — TRIAZOLAM 0.125 MG PO TABS
0.5000 mg | ORAL_TABLET | Freq: Every evening | ORAL | Status: AC | PRN
  Administered 2014-08-08: 0.5 mg via ORAL
  Filled 2014-08-08: qty 4

## 2014-08-08 NOTE — Evaluation (Signed)
Physical Therapy Evaluation Patient Details Name: Daniel [name] MRN: [id number] DOB: 1991/09/08 Today's Date: 08/08/2014   History of Present Illness  23 y.o. male admitted to [facility] on 08/05/14 for elective L1-4 fusion and L2 kyphoplasty after failed conservative managment of L2-3 compression fxs following a fall in August.  Pt with significant PMHx of anxiety, and per pt report R "arm" fx (also associated with his fall in August).   Clinical Impression  Pt is very limited by pain during my assessment.  He was very easily agitated by his pain and lines as well as therapists persistence for him to get up.  I anticipate that as his pain decreases that he should mobilize very well (as he was essentially min assist during today's session).  If he does not progress as well as anticipated, then HHPT can be requested in subsequent sessions.   PT to follow acutely for deficits listed below.       Follow Up Recommendations No PT follow up    Equipment Recommendations  None recommended by PT (as he is not supposed to use his right arm to press down)    Recommendations for Other Services   NA    Precautions / Restrictions Precautions Precautions: Back Precaution Booklet Issued: Yes (comment) Precaution Comments: Handout given and reviewed back precautions, lifting restrictions, walking TID at home, no sitting for greater than 30-45 mins at a time.  Required Braces or Orthoses: Spinal Brace Spinal Brace: Thoracolumbosacral orthotic;Applied in sitting position (orders indicate when OOB)      Mobility  Bed Mobility Overal bed mobility: Needs Assistance Bed Mobility: Rolling;Sidelying to Sit;Sit to Sidelying Rolling: Supervision Sidelying to sit: Supervision     Sit to sidelying: Supervision General bed mobility comments: Supervision for safety and max verbal cues for log roll technique.  Pt relying heavily on railing for support during transitions.   Transfers Overall transfer level:  Needs assistance Equipment used: None Transfers: Sit to/from [employer] Sit to Stand: +2 safety/equipment;Min assist Stand pivot transfers: +2 physical assistance;Min assist       General transfer comment: Pt impulsive and quick to move coming up over very flexed knees and scooting too close to the edge of his sitting surface before going to standing.  Pt is very tall and for first stand elevated the bed to make it higher and easier to get up on his feet.  Verbal cues for knee extension when going to standing.   Ambulation/Gait Ambulation/Gait assistance: +2 physical assistance;Min assist Ambulation Distance (Feet): 3 Feet Assistive device: None Gait Pattern/deviations: Step-to pattern     General Gait Details: 2-3 pivotal steps to the chair and then later back to the bed.          Balance Overall balance assessment: Needs assistance Sitting-balance support: Feet supported;Bilateral upper extremity supported;No upper extremity supported Sitting balance-Leahy Scale: Good     Standing balance support: Single extremity supported;Bilateral upper extremity supported;No upper extremity supported Standing balance-Leahy Scale: Fair Standing balance comment: flexed knees, quick to move.                              Pertinent Vitals/Pain Pain Assessment: 0-10 Pain Score: 8  Pain Location: incisional Pain Descriptors / Indicators: Aching;Burning Pain Intervention(s): Limited activity within patient's tolerance;Monitored during session;Repositioned    Home Living Family/patient expects to be discharged to:: Private residence Living Arrangements: Parent Available Help at Discharge: Family;Available PRN/intermittently (parents work days) Type  of Home: House Home Access: Stairs to enter Entrance Stairs-Rails: Left Entrance Stairs-Number of Steps: 6 Home Layout: Two level Home Equipment: None      Prior Function Level of Independence: Independent                Hand Dominance   Dominant Hand: Right    Extremity/Trunk Assessment   Upper Extremity Assessment: Defer to OT evaluation           Lower Extremity Assessment: Generalized weakness (limited by pain )      Cervical / Trunk Assessment: Normal  Communication   Communication: No difficulties  Cognition Arousal/Alertness: Awake/alert Behavior During Therapy: Agitated;Anxious;Impulsive Overall Cognitive Status: Within Functional Limits for tasks assessed                      General Comments General comments (skin integrity, edema, etc.): Pt very argumentative throughout session, easily irritated by IV lines, pain.  Also, requesting a nicotiene patch.  Much time spent reinforcing back precautions, importance of mobility and following back precautions and importance of OOB.            Assessment/Plan    PT Assessment Patient needs continued PT services  PT Diagnosis Difficulty walking;Abnormality of gait;Generalized weakness;Acute pain   PT Problem List Decreased strength;Decreased activity tolerance;Decreased balance;Decreased mobility;Decreased knowledge of use of DME;Decreased knowledge of precautions;Decreased safety awareness;Pain  PT Treatment Interventions DME instruction;Gait training;Stair training;Functional mobility training;Therapeutic activities;Therapeutic exercise;Balance training;Neuromuscular re-education;Cognitive remediation;Patient/family education;Modalities   PT Goals (Current goals can be found in the Care Plan section) Acute Rehab PT Goals Patient Stated Goal: to go home tomorrow PT Goal Formulation: With patient Time For Goal Achievement: 08/15/14 Potential to Achieve Goals: Good    Frequency Min 5X/week   Barriers to discharge Decreased caregiver support per pt his parents will be there intermittantly       End of Session Equipment Utilized During Treatment: Back brace Activity Tolerance: Patient limited by  pain Patient left: in bed;with call bell/phone within reach Nurse Communication: Mobility status         Time: 1126-1214 PT Time Calculation (min): 48 min   Charges:   PT Evaluation $Initial PT Evaluation Tier I: 1 Procedure PT Treatments $Therapeutic Activity: 8-22 mins $Self Care/Home Management: 8-22        Aubreana Cornacchia B. Lela Gell, PT, DPT [phone]   08/08/2014, 2:46 PM

## 2014-08-08 NOTE — Progress Notes (Signed)
Subjective: Doing ok.  Pain better controlled.  No bm in a few days.     Objective: Vital signs in last 24 hours: Temp:  [97.2 F (36.2 C)-98.4 F (36.9 C)] 97.5 F (36.4 C) (10/08 0536) Pulse Rate:  [69-94] 69 (10/08 0536) Resp:  [14-22] 14 (10/07 1730) BP: (122-163)/(59-114) 122/59 mmHg (10/08 0536) SpO2:  [96 %-100 %] 98 % (10/08 0536)  Intake/Output from previous day: 10/07 0701 - 10/08 0700 In: 2885 [P.O.:360; I.V.:2525] Out: 2550 [Urine:2500; Blood:50] Intake/Output this shift: Total I/O In: -  Out: 900 [Urine:900]   Recent Labs  08/05/14 1705  HGB 13.5    Recent Labs  08/05/14 1705  WBC 8.7  RBC 4.78  HCT 39.7  PLT 219    Recent Labs  08/05/14 1705  NA 139  K 4.1  CL 101  CO2 26  BUN 10  CREATININE 1.37*  GLUCOSE 88  CALCIUM 9.1    Recent Labs  08/05/14 1705  INR 1.12    Exam:  More alert.  Neurologically intact.    Assessment/Plan: Start PT. Give 1/2 bottle mag citrate now.  Possible d/c home Friday or Saturday depending on progression with PT.     Zonia KiefWENS,Misbah Hornaday M 08/08/2014, 9:21 AM

## 2014-08-08 NOTE — Op Note (Signed)
NAME:  Daniel, [name] NO.:  [id number]  MEDICAL RECORD NO.:  [id number]  LOCATION:  [id number]                        FACILITY:  [facility]  PHYSICIAN:  Alvy Beal, MD    DATE OF BIRTH:  1991-03-09  DATE OF PROCEDURE: DATE OF DISCHARGE:                              OPERATIVE REPORT   PREOPERATIVE DIAGNOSIS:  L2-L3 compression fracture with displacement.  POSTOPERATIVE DIAGNOSIS:  L2-L3 compression fracture with displacement.  OPERATIVE PROCEDURE: 1. Posterior instrumented fusion L1-4 with NuVasive pedicle screw     system.  Pedicle screws placed at L1 and L4, they were 45 mm in     length, 6.5 mm diameter. 2. L2 kyphoplasty.  COMPLICATIONS:  None.  CONDITION:  Stable.  FIRST ASSISTANT:  Genene Churn. Barry Dienes, my PA.  HISTORY:  This is a very pleasant 23 year old gentleman who fell and injured his back.  The patient was initially treated with a TLSO. Despite utilizing the TLSO, he continued to displace and have horrific pain, although there were no preoperative neurological deficits.  He was failing conservative treatment because of the bony instability.  As a result, we elected to proceed with surgery.  All appropriate risks, benefits, and alternatives were discussed with the patient.  Consent was obtained.  OPERATIVE NOTE:  The patient was brought to the operating room, placed supine on the operating room table.  After successful induction of general anesthesia and endotracheal intubation, TEDs, SCDs, and Foley were inserted.  He was turned prone onto the spine frame and all bony prominences were well-padded.  The back was then prepped and draped in standard fashion.  A time-out was taken confirming patient, procedure, and all other pertinent important data.  I then identified the lateral border of the L1 pedicle, infiltrated this area with 0.25% Marcaine, and made a small incision.  I advanced a Jamshidi needle down to the junction of the transverse process and  lateral wall of the pedicle using fluoroscopic guidance as well as intraoperative EMG monitoring.  I advanced a Jamshidi needle through the L1 pedicle down to the medial aspect.  On the lateral view, a Jamshidi needle was beyond the posterior vertebral body wall.  Once I confirmed trajectory in position, I advanced it into the vertebral body.  I removed it and placed a guide pin to cannulate the pedicle.  I repeated this entire procedure on the contralateral side of L1 and at L4.  Once all 4 pedicles were cannulated, I then proceeded with the kyphoplasty.  Again, a small incision was made on the lateral border of the L2 pedicle.  I advanced a Kyphon Jamshidi needle down to the appropriate starting position and then using fluoroscopic guidance, advanced the trocar through the pedicle.  Once I was nearing the medial border of the pedicle, I switched to the lateral view to confirm that I had indeed used the correct trajectory.  Once I was beyond the posterior vertebral body margin, I then repeated this on the contralateral side.  At this point, I had all 4 guide pins in the pedicles and the kyphoplasty trocars at L2.  I then drilled through the kyphoplasty and then palpated it to make sure it is a  solid bony canal.  I then placed 20 x 3 balloons and sequentially began inflating them.  While inflating them, I noticed that there was a balloon had reached the superior endplate of L2 and so I deflated it.  This occurred on the right-hand side.  At this point, with the cement mixing in the background, I then proceeded to tap the pedicles.  Over each guide pin, I placed tap and advanced it using fluoro and neuromonitoring.  I then placed the pedicle screws 6.5 diameter, 45 mm length.  Once all 4 screws were placed, I then stimulated each of the 4 screws and they all stimulated positive. At this point, the cement had become viscous enough to insert.  I then inserted on the left-hand side primarily  and I got a good lateral fill with medialization of the cement.  He remained right underneath the superior endplate and there was no leak into the disk space.  I did manage to get some cement on the right-hand side.  I was very cautious about this because this was a side that I had breached into the disk space.  Once all the cement was properly in place and allowed to cure, the final x-rays demonstrated all intervertebral location of the cement. There was no medial, superior, posterior, or anterior leak of cement. At this point, I then took a contoured rod and passed it from L1 down to L4.  Once I was confirmed I was in both pedicle screws, a gentle reduction maneuver was performed to improve the sagittal alignment and a locking cap was placed over the inferior aspect and was locked down.  I then locked the proximal end all according to manufacture's standards. At this point, I have a solid fixation.  All wounds were then copiously irrigated with normal saline.  Final x-rays were satisfactory.  Each wound was irrigated and closed in a layered fashion with interrupted #1 Vicryl sutures, 2-0 Vicryl sutures, and a 3-0 Monocryl.  Steri-Strips and dry dressing were applied.  The patient was ultimately extubated, transferred to PACU without incident.  At the end of the case, all needle and sponge counts were correct.  There were no adverse intraoperative events.  My PA, Zonia KiefJames Owens was my first assistant was instrumental in assisting with the position, reduction, stabilization, suction, and wound closure.     [name], MD     DDB/MEDQ  D:  08/07/2014  T:  08/08/2014  Job:  [id number]

## 2014-08-08 NOTE — Progress Notes (Signed)
Occupational Therapy Evaluation Patient Details Name: Daniel [name] MRN: [id number] DOB: [birth year] Today's Date: 08/08/2014    History of Present Illness 23 y.o. male admitted to [facility] on 08/05/14 for elective L1-4 fusion and L2 kyphoplasty after failed conservative managment of L2-3 compression fxs following a fall in August.  Pt with significant PMHx of anxiety, and per pt report R "arm" fx (also associated with his fall in August).    Clinical Impression   PTA pt lived at home with his parents and required assistance for LB ADLs since his fall in August. Pt is currently limited by increased pain and has difficulty with LB ADLs due to back precautions and TLSO. Pt will benefit from acute OT to provide AE training, educate on ADLs while maintaining precautions, and to increase strength and endurance for safety with functional mobility at home.     Follow Up Recommendations  No OT follow up;Supervision - Intermittent;Other (comment) (when OOB/mobility)    Equipment Recommendations  Other (comment) (AE (hip kit))    Recommendations for Other Services       Precautions / Restrictions Precautions Precautions: Back Precaution Booklet Issued: Yes (comment) Precaution Comments: Handout given and reviewed back precautions, lifting restrictions, walking TID at home, no sitting for greater than 30-45 mins at a time.  Required Braces or Orthoses: Spinal Brace Spinal Brace: Thoracolumbosacral orthotic;Applied in sitting position (orders indicate when OOB) Restrictions Weight Bearing Restrictions: No      Mobility Bed Mobility Overal bed mobility: Needs Assistance Bed Mobility: Rolling;Sidelying to Sit Rolling: Supervision Sidelying to sit: Supervision;HOB elevated       General bed mobility comments: Supervision for safety. Pt with good technique but moves quickly. Will plan to practice with HOB flat and bed rails down during next session.   Transfers Overall transfer level:  Needs assistance Equipment used: None Transfers: Sit to/from [facility] Sit to Stand: Min guard Stand pivot transfers: Min guard       General transfer comment: Pt somewhat impulsive and was eager to demonstrate his ability with OT in order to go home tomorrow. Educated pt on moving slowly upon return home to decrease risk of fall. Pt with improved sit<>stand technique.          ADL Overall ADL's : Needs assistance/impaired Eating/Feeding: Modified independent;Sitting Eating/Feeding Details (indicate cue type and reason): Encouraged pt not to use RUE to open tight containers Grooming: Min guard;Standing   Upper Body Bathing: Set up;Sitting   Lower Body Bathing: Set up;With adaptive equipment;Sit to/from stand Lower Body Bathing Details (indicate cue type and reason): Pt reports he has a long handled sponge at home and demonstrated use of long handled sponge to bath LB while sitting Upper Body Dressing : Minimal assistance;Sitting (including TLSO ; (A) to loop straps) Upper Body Dressing Details (indicate cue type and reason): VC's not to twist when donning back brace. Pt continued to twist to attempt to don and stated "It doesn't hurt my back because I have had my pain pills." Lower Body Dressing: Minimal assistance;With adaptive equipment;Sit to/from stand Lower Body Dressing Details (indicate cue type and reason): Pt return demo of use of Reacher and sock aid to doff/don socks. Educated pt on use of reacher to don elastic waist band pants (such as mesh basketball shorts that pt is currently wearing). Pt verbalized understanding and feels that this would provide him greater independence.  Toilet Transfer: Min guard;Ambulation     Toileting - Clothing Manipulation Details (indicate cue type and reason):  Educated pt on use of tongs/baby wipes for performing toilet hygiene. Pt stated that he typically removes TLSO to use the bathroom and OT encouraged pt to wear his TLSO  at ALL times when OOB, including when using the bathroom. Educated pt on twisting to perform toilet hygiene and purpose of brace to prevent pt from breaking back precautions.      Functional mobility during ADLs: Min guard General ADL Comments: Pt was pleasant and cooperative during OT session stating "I want to do what I need to in order to go home tomorrow." Educated pt on role of OT and purpose of session to increase independence. Educated pt on AE for LB ADLs. Encouraged pt to be compliant with TLSO at all times when OOB and educated pt on purpose of TLSO. Also educated pt on need to call for assistance when getting OOB while inpatient for safety to decrease risk of fall. Pt agreed to call for assist in an hour after sitting upright.       Vision  Pt reports no change from baseline. No apparent visual deficits.                    Perception Perception Perception Tested?: No   Praxis Praxis Praxis tested?: Within functional limits    Pertinent Vitals/Pain Pain Assessment: 0-10 Pain Score: 3  Pain Location: incisional Pain Descriptors / Indicators: Aching Pain Intervention(s): Limited activity within patient's tolerance;Monitored during session;Premedicated before session;Repositioned     Hand Dominance Right   Extremity/Trunk Assessment Upper Extremity Assessment Upper Extremity Assessment: RUE deficits/detail RUE Deficits / Details: Pt reports Rt "arm fx" (indicated humerus). Not documented in chart, however feel that he should be NWB for precaution on RUE. Will discuss possible need for x-ray with RN.   RUE: Unable to fully assess due to pain (pt reports he has a sling he is supposed to wear, but does n) RUE Coordination: decreased gross motor   Lower Extremity Assessment Lower Extremity Assessment: Defer to PT evaluation   Cervical / Trunk Assessment Cervical / Trunk Assessment: Normal   Communication Communication Communication: No difficulties   Cognition  Arousal/Alertness: Awake/alert Behavior During Therapy: WFL for tasks assessed/performed Overall Cognitive Status: Within Functional Limits for tasks assessed                     General Comments    Per RN, pt was educated on the purpose of therapy in aiding his safe return home. Pt was cooperative and pleasant with OT and thanked her for her time. Provided extensive education to pt regarding back precautions and purpose to promote healing. Encouraged pt to be compliant with TLSO wear when OOB.             Home Living Family/patient expects to be discharged to:: Private residence Living Arrangements: Parent Available Help at Discharge: Family;Available 24 hours/day (mom runs in-home daycare; dad works 1st and 2nd shifts alter) Type of Home: Education officer, community of Steps: 6 Entrance Stairs-Rails: Left Home Layout: Two level Alternate Level Stairs-Number of Steps: 12 Alternate Level Stairs-Rails: Left           Home Equipment: None          Prior Functioning/Environment Level of Independence: Needs assistance    ADL's / Homemaking Assistance Needed: Pt reports since his fall, his mother has had to help with LB ADLs. Pt reports he has been wearing flip flops due to injured 5th toe (right foot) and  ease of donning/doffing.         OT Diagnosis: Generalized weakness;Acute pain   OT Problem List: Decreased strength;Decreased range of motion;Decreased activity tolerance;Impaired balance (sitting and/or standing);Decreased safety awareness;Decreased knowledge of use of DME or AE;Decreased knowledge of precautions;Impaired UE functional use;Pain   OT Treatment/Interventions: Self-care/ADL training;Therapeutic exercise;Energy conservation;DME and/or AE instruction;Therapeutic activities;Patient/family education;Balance training    OT Goals(Current goals can be found in the care plan section) Acute Rehab OT Goals Patient Stated Goal: to go home tomorrow OT Goal  Formulation: With patient Time For Goal Achievement: 08/22/14 Potential to Achieve Goals: Good ADL Goals Pt Will Perform Grooming: standing;with supervision Pt Will Perform Upper Body Dressing: with set-up;with supervision;sitting (including TLSO) Pt Will Perform Lower Body Dressing: with set-up;with supervision;with adaptive equipment;sit to/from stand Pt Will Transfer to Toilet: with supervision;ambulating;regular height toilet Pt Will Perform Toileting - Clothing Manipulation and hygiene: with supervision;with set-up;with adaptive equipment;sit to/from stand Pt Will Perform Tub/Shower Transfer: with supervision;ambulating Additional ADL Goal #1: Pt will verbalize 3/3 back precautions independently to increase safety with ADLs.  Additional ADL Goal #2: Pt will perform bed mobility using log roll at Mod I level to prepare for ADLs.   OT Frequency: Min 2X/week    End of Session Equipment Utilized During Treatment: Gait belt;Back brace Nurse Communication: Other (comment) (pt to sit up for 1 hour, then will need assist to return to )  Activity Tolerance: Patient tolerated treatment well Patient left: in chair;with call bell/phone within reach   Time: 1356-1437 OT Time Calculation (min): 41 min Charges:  OT General Charges $OT Visit: 1 Procedure OT Evaluation $Initial OT Evaluation Tier I: 1 Procedure OT Treatments $Self Care/Home Management : 23-37 mins  Juluis Rainier 08/08/2014, 3:44 PM  Cyndie Chime, OTR/L Occupational Therapist [phone] (pager)

## 2014-08-09 MED ORDER — METHOCARBAMOL 500 MG PO TABS: 500.0000 mg | ORAL_TABLET | Freq: Four times a day (QID) | ORAL | Status: DC | PRN

## 2014-08-09 MED ORDER — POLYETHYLENE GLYCOL 3350 17 G PO PACK: 17.0000 g | PACK | Freq: Every day | ORAL | Status: AC

## 2014-08-09 MED ORDER — ONDANSETRON 4 MG PO TBDP: 4.0000 mg | ORAL_TABLET | Freq: Three times a day (TID) | ORAL | Status: DC | PRN

## 2014-08-09 MED ORDER — OXYCODONE-ACETAMINOPHEN 10-325 MG PO TABS: 1.0000 | ORAL_TABLET | Freq: Four times a day (QID) | ORAL | Status: DC | PRN

## 2014-08-09 MED ORDER — DOCUSATE SODIUM 100 MG PO CAPS: 100.0000 mg | ORAL_CAPSULE | Freq: Two times a day (BID) | ORAL | Status: AC

## 2014-08-09 NOTE — Care Management Note (Signed)
CARE MANAGEMENT NOTE 08/09/2014  Patient:  Carolann LittlerGROOMS,Chasyn HUNTER   Account Number:  [id number]  Date Initiated:  08/09/2014  Documentation initiated by:  Vance PeperBRADY,Sarafina Puthoff  Subjective/Objective Assessment:   23 yr old male admitted with L2-L3 compression fractures, underwent L2-L3 fusion and kyphoplasty.     Action/Plan:   Patient has no home health or DME needs.   Anticipated DC Date:  08/09/2014   Anticipated DC Plan:  HOME/SELF CARE      DC Planning Services  CM consult      PAC Choice  NA   Choice offered to / List presented to:  NA   DME arranged  NA        HH arranged  NA      Status of service:  Completed, signed off Medicare Important Message given?   (If response is "NO", the following Medicare IM given date fields will be blank) Date Medicare IM given:   Medicare IM given by:   Date Additional Medicare IM given:   Additional Medicare IM given by:    Discharge Disposition:  HOME/SELF CARE  Per UR Regulation:  Reviewed for med. necessity/level of care/duration of stay  If discussed at Long Length of Stay Meetings, dates discussed:

## 2014-08-09 NOTE — Progress Notes (Signed)
Subjective: Patient seen this AM.  Doing better.  More alert.  Pain controlled.  Wants to go home today.  Has not ambulated in hall yet.     Objective: Vital signs in last 24 hours: Temp:  [98.4 F (36.9 C)-98.7 F (37.1 C)] 98.7 F (37.1 C) (10/09 0558) Pulse Rate:  [75-94] 88 (10/09 0558) Resp:  [16-18] 17 (10/09 0558) BP: (127-144)/(69-99) 142/98 mmHg (10/09 0558) SpO2:  [97 %-100 %] 100 % (10/09 0558)  Intake/Output from previous day: 10/08 0701 - 10/09 0700 In: 2160 [P.O.:1260; I.V.:900] Out: 4450 [Urine:4450] Intake/Output this shift:    No results found for this basename: HGB,  in the last 72 hours No results found for this basename: WBC, RBC, HCT, PLT,  in the last 72 hours No results found for this basename: NA, K, CL, CO2, BUN, CREATININE, GLUCOSE, CALCIUM,  in the last 72 hours No results found for this basename: LABPT, INR,  in the last 72 hours  Exam:  Wounds look good.  steris intact.  No drainage or signs of infection.   bilat calves NT and NVI.    Assessment/Plan: D/c home today if moves well with PT.  Dressing changed.     Willford Rabideau M 08/09/2014, 10:13 AM

## 2014-08-09 NOTE — Progress Notes (Signed)
Occupational Therapy Treatment Patient Details Name: Daniel CockayneWilliam Hunter [name] MRN: [id number] DOB: Aug 20, 1991 Today's Date: 08/09/2014    History of present illness 23 y.o. male admitted [facility] 08/05/14 for elective L1-4 fusion and L2 kyphoplasty after failed conservative managment of L2-3 compression fxs following a fall in August.  Pt with significant PMHx of anxiety, and per pt report R "arm" fx (also associated with his fall in August).    OT comments  Pt seen today to practice ADLs safely using back precautions and to progress functional mobility. Pt continues to be impulsive and easily frustrated, especially when in pain. Pt will benefit from continued instruction from acute OT on back precautions and safe techniques for ADLs prior to d/c home. Concerned about pt's lack of insight regarding safety awareness and purpose of back precautions.    Follow Up Recommendations  No OT follow up;Supervision - Intermittent;Other (comment) (when OOB/mobility)    Equipment Recommendations  3 in 1 bedside comode    Recommendations for Other Services      Precautions / Restrictions Precautions Precautions: Back Precaution Comments: Patient reeducated on precautions however is not compliant Required Braces or Orthoses: Spinal Brace Spinal Brace: Thoracolumbosacral orthotic;Applied in sitting position Restrictions Weight Bearing Restrictions: No       Mobility Bed Mobility Overal bed mobility: Needs Assistance Bed Mobility: Rolling;Sidelying to Sit;Sit to Sidelying Rolling: Supervision Sidelying to sit: Supervision;HOB elevated     Sit to sidelying: Supervision General bed mobility comments: Cues for technique  Transfers Overall transfer level: Needs assistance Equipment used: None Transfers: Sit to/from Stand Sit to Stand: Min guard         General transfer comment: Pt somewhat impulsive and was eager to demonstrate his ability with OT in order to go home today. Educated pt on  moving slowly upon return home to decrease risk of fall.         ADL Overall ADL's : Needs assistance/impaired     Grooming: Min guard;Standing;Cueing for safety;Cueing for sequencing Grooming Details (indicate cue type and reason): Pt c/o pain in Right lower lumbar and became frustrated, throwing toothbrush to the ground and rushing to return to bed. Pt slumped into doorway on walk back.          Upper Body Dressing : Minimal assistance;Sitting Upper Body Dressing Details (indicate cue type and reason): (A) to loop straps initially, however pt able to adjust appropriately.      Toilet Transfer: Min guard;Ambulation Toilet Transfer Details (indicate cue type and reason): Pt holding onto furniture on walk. When asked why, he states "I think it will help my back pain." Therapist asked if it actually helped his pain and he  responded "No."         Functional mobility during ADLs: Min guard General ADL Comments: Pt eager to return home and stating he does not have concerns and he will "make it work." OT explained safety concerns and pt reporting that he "does not care if he injures himself"                Cognition  Arousal/Alertness: Awake/Alert Behavior During Therapy: Impulsive (irritable; frustrated) Overall Cognitive Status: Within Functional Limits for tasks assessed Area of Impairment: Safety/judgement     Memory: Decreased recall of precautions    Safety/Judgement: Decreased awareness of safety     General Comments: Pt is impulsive and easily frustrated. Feel that this is likely baseline, but contributes to unsafe techniques. Pt is not receptive to therapeutic instruction or suggestions.  Pt has stated "that's not how I'[name] going to do it at home" when therapist discusses safe techniques.                  Pertinent Vitals/ Pain       Pain Assessment: 0-10 Pain Score: 8  Pain Location: back Pain Descriptors / Indicators: Aching;Sore Pain Intervention(s):  Limited activity within patient's tolerance;Monitored during session;Repositioned;Relaxation         Frequency Min 2X/week     Progress Toward Goals  OT Goals(current goals can now be found in the care plan section)  Progress towards OT goals: Progressing toward goals (slowly)  Acute Rehab OT Goals Patient Stated Goal: home today OT Goal Formulation: With patient Time For Goal Achievement: 08/22/14 Potential to Achieve Goals: Good ADL Goals Pt Will Perform Grooming: standing;with supervision Pt Will Perform Upper Body Dressing: with set-up;with supervision;sitting Pt Will Perform Lower Body Dressing: with set-up;with supervision;with adaptive equipment;sit to/from stand Pt Will Transfer to Toilet: with supervision;ambulating;regular height toilet Pt Will Perform Toileting - Clothing Manipulation and hygiene: with supervision;with set-up;with adaptive equipment;sit to/from stand Pt Will Perform Tub/Shower Transfer: with supervision;ambulating Additional ADL Goal #1: Pt will verbalize 3/3 back precautions independently to increase safety with ADLs.  Additional ADL Goal #2: Pt will perform bed mobility using log roll at Mod I level to prepare for ADLs.   Plan Discharge plan remains appropriate       End of Session Equipment Utilized During Treatment: Gait belt;Back brace   Activity Tolerance Patient tolerated treatment well   Patient Left in bed;with call bell/phone within reach   Nurse Communication Other (comment) (pt irritated and impulsive)        Time: [id number] OT Time Calculation (min): 38 min  Charges: OT General Charges $OT Visit: 1 Procedure OT Treatments $Self Care/Home Management : 23-37 mins $Therapeutic Activity: 8-22 mins  Nena JordanMiller, Daniel [name] 08/09/2014, 1:55 PM  Carney LivingLeeAnn Marie Alexandre Faries, OTR/L Occupational Therapist [phone] (pager)

## 2014-08-09 NOTE — Progress Notes (Signed)
Physical Therapy Treatment Patient Details Name: Daniel CockayneWilliam Hunter Ayub MRN: [id number] DOB: 1991-09-25 Today's Date: 08/09/2014    History of Present Illness 23 y.o. male [facility] for elective L1-4 fusion and L2 kyphoplasty after failed conservative managment of L2-3 compression fxs following a fall in August.  Pt with significant PMHx of anxiety, and per pt report R "arm" fx (also associated with his fall in August).     PT Comments    Patient impulsive and unsafe throughout session. Unaware of maintaining precautions with mobility. Patient initially stating that he could not walk however when educating patient on needing to ambulate and complete stairs patient impulsively attempting to walk very fast without regards to safety almost causing himself to fall. At which point, I educated patient on safety and he stated, "I don't care if I fall". Patient was able to complete 2 steps with education on safety and importance of following precautions. Patient was left in room sitting in recliner requesting pain meds. RN aware of session. At this time it is my belief that patient has done all he is willing to do with therapy and has been educated on safety and precautions. It is now the patients responsibility to do what has been taught. Patient stated he is set up with assistance at home and is ready to leave.   Follow Up Recommendations  No PT follow up     Equipment Recommendations  None recommended by PT    Recommendations for Other Services       Precautions / Restrictions Precautions Precautions: Back Precaution Comments: Patient reeducated on precautions however is not compliant Required Braces or Orthoses: Spinal Brace Spinal Brace: Thoracolumbosacral orthotic;Applied in sitting position Restrictions Weight Bearing Restrictions: No    Mobility  Bed Mobility Overal bed mobility: Needs Assistance Bed Mobility: Rolling;Sidelying to Sit;Sit to Sidelying Rolling:  Supervision Sidelying to sit: Supervision;HOB elevated     Sit to sidelying: Supervision General bed mobility comments: Cues for technique  Transfers Overall transfer level: Needs assistance Equipment used: None Transfers: Sit to/from Stand Sit to Stand: Min guard         General transfer comment: Pt somewhat impulsive and was eager to demonstrate his ability with OT in order to go home today. Educated pt on moving slowly upon return home to decrease risk of fall.   Ambulation/Gait Ambulation/Gait assistance: +2 safety/equipment;Mod assist Ambulation Distance (Feet): 50 Feet Assistive device: None Gait Pattern/deviations: Step-through pattern;Decreased stride length     General Gait Details: Cues for maximal safety as patient highly impulsive   Stairs Stairs: Yes Stairs assistance: Min assist Stair Management: Step to pattern;Forwards;Two rails Number of Stairs: 2 General stair comments: Min A for safety. Cues for safest technique  Wheelchair Mobility    Modified Rankin (Stroke Patients Only)       Balance                                    Cognition Arousal/Alertness: Awake/alert Behavior During Therapy: Impulsive (irritable; frustrated) Overall Cognitive Status: Within Functional Limits for tasks assessed Area of Impairment: Safety/judgement     Memory: Decreased recall of precautions   Safety/Judgement: Decreased awareness of safety     General Comments: Pt is impulsive and easily frustrated. Feel that this is likely baseline, but contributes to unsafe techniques. Pt is not receptive to therapeutic instruction or suggestions. Pt has stated "that's not how I'm going to  do it at home" when therapist discusses safe techniques.     Exercises      General Comments        Pertinent Vitals/Pain Pain Assessment: 0-10 Pain Score: 8  Pain Location: back Pain Descriptors / Indicators: Aching;Sore Pain Intervention(s): Limited activity  within patient's tolerance;Monitored during session;Repositioned;Relaxation    Home Living                      Prior Function            PT Goals (current goals can now be found in the care plan section) Acute Rehab PT Goals Patient Stated Goal: home today Progress towards PT goals: Progressing toward goals    Frequency  Min 5X/week    PT Plan Current plan remains appropriate    Co-evaluation             End of Session Equipment Utilized During Treatment: Back brace Activity Tolerance: Patient limited by pain Patient left: in chair;with call bell/phone within reach     Time: [id number] PT Time Calculation (min): 18 min  Charges:  $Gait Training: 8-22 mins                    G Codes:      Fredrich BirksRobinette, Aileene Lanum Elizabeth 08/09/2014, 2:17 PM 08/09/2014 Fredrich Birksobinette, Kyrstal Monterrosa Elizabeth PTA [phone] pager [phone] office

## 2014-08-09 NOTE — Anesthesia Postprocedure Evaluation (Signed)
  Anesthesia Post-op Note  Patient: Daniel CockayneWilliam Hunter [name]  Procedure(s) Performed: Procedure(s): L1 - L4 INSTRUMENTED FUSION AND L2 KYPHOPLASTY (N/A)  Patient Location: PACU  Anesthesia Type:General  Level of Consciousness: awake and alert   Airway and Oxygen Therapy: Patient Spontanous Breathing  Post-op Pain: moderate  Post-op Assessment: Post-op Vital signs reviewed  Post-op Vital Signs: stable  Last Vitals:  Filed Vitals:   08/09/14 1234  BP: 149/96  Pulse: 103  Temp: 36.8 C  Resp: 18    Complications: No apparent anesthesia complications

## 2014-08-14 ENCOUNTER — Encounter (HOSPITAL_COMMUNITY): Payer: Self-pay | Admitting: Orthopedic Surgery

## 2014-09-13 ENCOUNTER — Other Ambulatory Visit: Payer: Self-pay | Admitting: Orthopedic Surgery

## 2014-09-13 DIAGNOSIS — S32020A Wedge compression fracture of second lumbar vertebra, initial encounter for closed fracture: Secondary | ICD-10-CM

## 2014-09-16 ENCOUNTER — Ambulatory Visit
Admission: RE | Admit: 2014-09-16 | Discharge: 2014-09-16 | Disposition: A | Discharge status: Home / Self Care | Payer: Managed Care, Other (non HMO) | Source: Ambulatory Visit | Attending: Orthopedic Surgery | Admitting: Orthopedic Surgery

## 2014-09-16 ENCOUNTER — Other Ambulatory Visit: Payer: Self-pay | Admitting: Orthopedic Surgery

## 2014-09-16 DIAGNOSIS — S32020S Wedge compression fracture of second lumbar vertebra, sequela: Secondary | ICD-10-CM

## 2014-09-16 DIAGNOSIS — S32020A Wedge compression fracture of second lumbar vertebra, initial encounter for closed fracture: Secondary | ICD-10-CM

## 2014-09-24 ENCOUNTER — Ambulatory Visit (HOSPITAL_COMMUNITY)
Admission: RE | Admit: 2014-09-24 | Discharge: 2014-09-24 | Disposition: A | Discharge status: Home / Self Care | Payer: Managed Care, Other (non HMO) | Source: Ambulatory Visit | Attending: Anesthesiology | Admitting: Anesthesiology

## 2014-09-24 ENCOUNTER — Encounter (HOSPITAL_COMMUNITY): Payer: Self-pay | Admitting: *Deleted

## 2014-09-24 ENCOUNTER — Encounter (HOSPITAL_COMMUNITY)
Admission: RE | Admit: 2014-09-24 | Discharge: 2014-09-24 | Disposition: A | Discharge status: Home / Self Care | Payer: Managed Care, Other (non HMO) | Source: Ambulatory Visit | Attending: Orthopedic Surgery | Admitting: Orthopedic Surgery

## 2014-09-24 ENCOUNTER — Encounter (HOSPITAL_COMMUNITY): Payer: Self-pay

## 2014-09-24 DIAGNOSIS — E780 Pure hypercholesterolemia: Secondary | ICD-10-CM | POA: Diagnosis not present

## 2014-09-24 DIAGNOSIS — F419 Anxiety disorder, unspecified: Secondary | ICD-10-CM | POA: Diagnosis not present

## 2014-09-24 DIAGNOSIS — F17210 Nicotine dependence, cigarettes, uncomplicated: Secondary | ICD-10-CM | POA: Diagnosis not present

## 2014-09-24 DIAGNOSIS — Z981 Arthrodesis status: Secondary | ICD-10-CM

## 2014-09-24 DIAGNOSIS — M4856XA Collapsed vertebra, not elsewhere classified, lumbar region, initial encounter for fracture: Secondary | ICD-10-CM | POA: Insufficient documentation

## 2014-09-24 DIAGNOSIS — Z01818 Encounter for other preprocedural examination: Secondary | ICD-10-CM

## 2014-09-24 DIAGNOSIS — Y793 Surgical instruments, materials and orthopedic devices (including sutures) associated with adverse incidents: Secondary | ICD-10-CM | POA: Diagnosis not present

## 2014-09-24 DIAGNOSIS — T8489XA Other specified complication of internal orthopedic prosthetic devices, implants and grafts, initial encounter: Secondary | ICD-10-CM | POA: Diagnosis not present

## 2014-09-24 DIAGNOSIS — Z885 Allergy status to narcotic agent status: Secondary | ICD-10-CM | POA: Diagnosis not present

## 2014-09-24 DIAGNOSIS — Z01811 Encounter for preprocedural respiratory examination: Secondary | ICD-10-CM

## 2014-09-24 LAB — COMPREHENSIVE METABOLIC PANEL
ALT: 13 U/L (ref 0–53)
AST: 16 U/L (ref 0–37)
Albumin: 4.7 g/dL (ref 3.5–5.2)
Alkaline Phosphatase: 103 U/L (ref 39–117)
Anion gap: 17 — ABNORMAL HIGH (ref 5–15)
BUN: 10 mg/dL (ref 6–23)
CO2: 23 mEq/L (ref 19–32)
CREATININE: 1.27 mg/dL (ref 0.50–1.35)
Calcium: 10.3 mg/dL (ref 8.4–10.5)
Chloride: 100 mEq/L (ref 96–112)
GFR calc non Af Amer: 78 mL/min — ABNORMAL LOW (ref >90)
GLUCOSE: 106 mg/dL — AB (ref 70–99)
Potassium: 4.4 mEq/L (ref 3.7–5.3)
Sodium: 140 mEq/L (ref 137–147)
Total Bilirubin: 0.6 mg/dL (ref 0.3–1.2)
Total Protein: 8.7 g/dL — ABNORMAL HIGH (ref 6.0–8.3)

## 2014-09-24 LAB — APTT: APTT: 35 s (ref 24–37)

## 2014-09-24 LAB — CBC
HCT: 47.9 % (ref 39.0–52.0)
Hemoglobin: 16.2 g/dL (ref 13.0–17.0)
MCH: 28.6 pg (ref 26.0–34.0)
MCHC: 33.8 g/dL (ref 30.0–36.0)
MCV: 84.6 fL (ref 78.0–100.0)
PLATELETS: 350 10*3/uL (ref 150–400)
RBC: 5.66 MIL/uL (ref 4.22–5.81)
RDW: 14.2 % (ref 11.5–15.5)
WBC: 11.9 10*3/uL — AB (ref 4.0–10.5)

## 2014-09-24 LAB — URINALYSIS, ROUTINE W REFLEX MICROSCOPIC
Glucose, UA: NEGATIVE mg/dL
Hgb urine dipstick: NEGATIVE
Ketones, ur: 15 mg/dL — AB
Leukocytes, UA: NEGATIVE
NITRITE: NEGATIVE
PROTEIN: 30 mg/dL — AB
SPECIFIC GRAVITY, URINE: 1.035 — AB (ref 1.005–1.030)
UROBILINOGEN UA: 0.2 mg/dL (ref 0.0–1.0)
pH: 5.5 (ref 5.0–8.0)

## 2014-09-24 LAB — SURGICAL PCR SCREEN
MRSA, PCR: NEGATIVE
STAPHYLOCOCCUS AUREUS: POSITIVE — AB

## 2014-09-24 LAB — URINE MICROSCOPIC-ADD ON

## 2014-09-24 LAB — PROTIME-INR
INR: 1.06 (ref 0.00–1.49)
Prothrombin Time: 13.9 seconds (ref 11.6–15.2)

## 2014-09-24 MED ORDER — ACETAMINOPHEN 10 MG/ML IV SOLN
1000.0000 mg | INTRAVENOUS | Status: DC
  Filled 2014-09-24: qty 100

## 2014-09-24 MED ORDER — DEXTROSE 5 % IV SOLN
3.0000 g | INTRAVENOUS | Status: AC
  Administered 2014-09-25: 3 g via INTRAVENOUS
  Filled 2014-09-24: qty 3000

## 2014-09-24 MED ORDER — MUPIROCIN 2 % EX OINT
1.0000 "application " | TOPICAL_OINTMENT | Freq: Once | CUTANEOUS | Status: AC
  Administered 2014-09-25: 1 via TOPICAL
  Filled 2014-09-24: qty 22

## 2014-09-24 MED ORDER — CEFAZOLIN SODIUM-DEXTROSE 2-3 GM-% IV SOLR: 2.0000 g | INTRAVENOUS | Status: DC

## 2014-09-24 MED ORDER — DEXAMETHASONE SODIUM PHOSPHATE 10 MG/ML IJ SOLN
10.0000 mg | INTRAMUSCULAR | Status: DC
  Filled 2014-09-24: qty 1

## 2014-09-24 NOTE — H&P (Cosign Needed)
Daniel [name] is an 23 y.o. male.   History of Present Illness  The patient is a 23 year old male who comes in today for a preoperative History and Physical. The patient is scheduled for a revision of pedicle screw to be performed by Dr. Duane Lope D. Rolena Infante, MD at [facility] on 09-25-14 .   Allergies  OxyCODONE HCl *ANALGESICS - OPIOID* Hydrocodone-Acetaminophen *ANALGESICS - OPIOID* Itching. We had oxycodone as an allergy, but he states he IS able to take that./ljf  Family History Kendra Opitz Young; 09/24/2014 9:00 AM) Hypertension Maternal Grandmother. Diabetes Mellitus Maternal Grandfather. Chronic Obstructive Lung Disease Maternal Grandmother. First Degree Relatives  Social History  Marital status single Not under pain contract Exercise Exercises weekly; does running / walking and gym / weights Tobacco use Former smoker. 09/18/2013 Living situation live with parents No history of drug/alcohol rehab Tobacco / smoke exposure 09/18/2013: no Number of flights of stairs before winded 4-5 Children 0 Current work status working full time Current drinker 09/18/2013: Currently drinks beer only occasionally per week  Medication History Percocet (10-325MG Tablet, 1 (one) Oral every six hours, as needed, Taken starting 09/12/2014) Active. ALPRAZolam (1MG Tablet, Oral) Active. (as needed) SEROquel (400MG Tablet, Oral) Active. (817m qhs) Medications Reconciled  Other Problems Anxiety Disorder Hypercholesterolemia   Vitals 09/24/2014 9:03 AM Weight: 270 lb Height: 75in Body Surface Area: 2.49 m Body Mass Index: 33.75 kg/m  Temp.: 98.102F(Oral)  BP: 133/98 (Sitting, Right Arm, Standard)   Past Medical History  Diagnosis Date  . Fractures     L2   L3   . Anxiety     panic attacks  . Shortness of breath dyspnea     with severe pain    Past Surgical History  Procedure Laterality Date  . Tonsillectomy    . Tympanostomy tube  placement    . Kyphoplasty N/A 08/07/2014    Procedure: L1 - L4 INSTRUMENTED FUSION AND L2 KYPHOPLASTY;  Surgeon: DMelina Schools MD;  [place]  Service: Orthopedics;  Laterality: N/A;    Family History  Problem Relation Age of Onset  . Hyperlipidemia Father    Social History:  reports that he has been smoking Cigarettes.  He has a 5 pack-year smoking history. His smokeless tobacco use includes Snuff. He reports that he does not drink alcohol or use illicit drugs.  Allergies:  Allergies  Allergen Reactions  . Hydrocodone-Acetaminophen Shortness Of Breath and Itching    Oxycodone does not cause itching    No prescriptions prior to admission    Results for orders placed or performed during the hospital encounter of 09/24/14 (from the past 48 hour(s))  Surgical pcr screen     Status: Abnormal   Collection Time: 09/24/14 11:08 AM  Result Value Ref Range   MRSA, PCR NEGATIVE NEGATIVE   Staphylococcus aureus POSITIVE (A) NEGATIVE    Comment:        The Xpert SA Assay (FDA approved for NASAL specimens in patients over 211years of age), is one component of a comprehensive surveillance program.  Test performance has been validated by [employer]for patients greater than or equal to 167year old. It is not intended to diagnose infection nor to guide or monitor treatment.   APTT     Status: None   Collection Time: 09/24/14 11:09 AM  Result Value Ref Range   aPTT 35 24 - 37 seconds  CBC     Status: Abnormal   Collection Time: 09/24/14 11:09  AM  Result Value Ref Range   WBC 11.9 (H) 4.0 - 10.5 K/uL   RBC 5.66 4.22 - 5.81 MIL/uL   Hemoglobin 16.2 13.0 - 17.0 g/dL   HCT 47.9 39.0 - 52.0 %   MCV 84.6 78.0 - 100.0 fL   MCH 28.6 26.0 - 34.0 pg   MCHC 33.8 30.0 - 36.0 g/dL   RDW 14.2 11.5 - 15.5 %   Platelets 350 150 - 400 K/uL  Comprehensive metabolic panel     Status: Abnormal   Collection Time: 09/24/14 11:09 AM  Result Value Ref Range   Sodium 140 137 - 147 mEq/L    Potassium 4.4 3.7 - 5.3 mEq/L   Chloride 100 96 - 112 mEq/L   CO2 23 19 - 32 mEq/L   Glucose, Bld 106 (H) 70 - 99 mg/dL   BUN 10 6 - 23 mg/dL   Creatinine, Ser 1.27 0.50 - 1.35 mg/dL   Calcium 10.3 8.4 - 10.5 mg/dL   Total Protein 8.7 (H) 6.0 - 8.3 g/dL   Albumin 4.7 3.5 - 5.2 g/dL   AST 16 0 - 37 U/L   ALT 13 0 - 53 U/L   Alkaline Phosphatase 103 39 - 117 U/L   Total Bilirubin 0.6 0.3 - 1.2 mg/dL   GFR calc non Af Amer 78 (L) >90 mL/min   GFR calc Af Amer >90 >90 mL/min    Comment: (NOTE) The eGFR has been calculated using the CKD EPI equation. This calculation has not been validated in all clinical situations. eGFR's persistently <90 mL/min signify possible Chronic Kidney Disease.    Anion gap 17 (H) 5 - 15  Protime-INR     Status: None   Collection Time: 09/24/14 11:09 AM  Result Value Ref Range   Prothrombin Time 13.9 11.6 - 15.2 seconds   INR 1.06 0.00 - 1.49  Urinalysis, Routine w reflex microscopic     Status: Abnormal   Collection Time: 09/24/14 11:14 AM  Result Value Ref Range   Color, Urine AMBER (A) YELLOW    Comment: BIOCHEMICALS MAY BE AFFECTED BY COLOR   APPearance CLEAR CLEAR   Specific Gravity, Urine 1.035 (H) 1.005 - 1.030   pH 5.5 5.0 - 8.0   Glucose, UA NEGATIVE NEGATIVE mg/dL   Hgb urine dipstick NEGATIVE NEGATIVE   Bilirubin Urine SMALL (A) NEGATIVE   Ketones, ur 15 (A) NEGATIVE mg/dL   Protein, ur 30 (A) NEGATIVE mg/dL   Urobilinogen, UA 0.2 0.0 - 1.0 mg/dL   Nitrite NEGATIVE NEGATIVE   Leukocytes, UA NEGATIVE NEGATIVE  Urine microscopic-add on     Status: Abnormal   Collection Time: 09/24/14 11:14 AM  Result Value Ref Range   Squamous Epithelial / LPF RARE RARE   WBC, UA 0-2 <3 WBC/hpf   Bacteria, UA FEW (A) RARE   Urine-Other MUCOUS PRESENT    Dg Chest 2 View  09/24/2014   CLINICAL DATA:  Preop lumbar spine surgery  EXAM: CHEST  2 VIEW  COMPARISON:  None.  FINDINGS: The heart size and mediastinal contours are within normal limits. Both  lungs are clear.  Posterior lumbar fusion hardware is partially visualized. There are compression fractures of the L1 and L2 vertebral bodies.  IMPRESSION: No active cardiopulmonary disease.   Electronically Signed   By: Kathreen Devoid   On: 09/24/2014 14:10    Review of Systems  Constitutional: Negative.   HENT: Negative.   Eyes: Negative.   Respiratory: Negative.   Cardiovascular: Negative.  Gastrointestinal: Negative.   Genitourinary: Negative.   Skin: Negative.   Neurological: Negative.   Psychiatric/Behavioral: Negative.     There were no vitals taken for this visit. Physical Exam  Constitutional: He is oriented to person, place, and time. He appears well-developed and well-nourished.  HENT:  Head: Normocephalic and atraumatic.  Eyes: EOM are normal. Pupils are equal, round, and reactive to light.  Neck: Normal range of motion.  Cardiovascular: Normal rate and regular rhythm.   Respiratory: Effort normal and breath sounds normal.  GI: Soft.  Musculoskeletal: Normal range of motion.  Neurological: He is alert and oriented to person, place, and time.  Skin: Skin is warm and dry.  Psychiatric: He has a normal mood and affect.     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 Patient is aware that despite all appropriate treatment that there pain and function could be the same, worse, or different OWENS,JAMES M 09/24/2014, 4:35 PM

## 2014-09-24 NOTE — Pre-Procedure Instructions (Signed)
Daniel ArtisWilliam H [name]  09/24/2014   Your procedure is scheduled on:  Wednesday, November 25.  [facility] at 10:45 AM.  Call this number if you have problems the morning of surgery: [phone]   Remember:   Do not eat food or drink liquids after midnight.   Take these medicines the morning of surgery with A SIP OF WATER: alprazolam [name](XANAX).              May take oxyCODONE-acetaminophen [facility](PERCOCET) if needed.                Stop taking Ibuprofen.   Do not wear jewelry, make-up or nail polish.  Do not wear lotions, powders, or perfumes.               Men may shave face and neck.  Do not bring valuables to the hospital.               [facility] is not responsible  for any belongings or valuables.               Contacts, dentures or bridgework may not be worn into surgery.  Leave suitcase in the car. After surgery it may be brought to your room.  For patients admitted to the hospital, discharge time is determined by your treatment team.               Patients discharged the day of surgery will not be allowed to drive home.  Name and phone number of your driver: -   Special Instructions: Review  [facility] [place] - Preparing For Surgery.   Please read over the following fact sheets that you were given: Pain Booklet, Coughing and Deep Breathing and Surgical Site Infection Prevention

## 2014-09-25 ENCOUNTER — Ambulatory Visit (HOSPITAL_COMMUNITY): Payer: Managed Care, Other (non HMO) | Admitting: Certified Registered Nurse Anesthetist

## 2014-09-25 ENCOUNTER — Ambulatory Visit (HOSPITAL_COMMUNITY): Payer: Managed Care, Other (non HMO)

## 2014-09-25 ENCOUNTER — Encounter (HOSPITAL_COMMUNITY)
Admission: RE | Disposition: A | Discharge status: Home / Self Care | Payer: Self-pay | Source: Ambulatory Visit | Attending: Orthopedic Surgery

## 2014-09-25 ENCOUNTER — Observation Stay (HOSPITAL_COMMUNITY)
Admission: RE | Admit: 2014-09-25 | Discharge: 2014-09-27 | Disposition: A | Discharge status: Home / Self Care | Payer: Managed Care, Other (non HMO) | Source: Ambulatory Visit | Attending: Orthopedic Surgery | Admitting: Orthopedic Surgery

## 2014-09-25 ENCOUNTER — Encounter (HOSPITAL_COMMUNITY): Payer: Self-pay | Admitting: *Deleted

## 2014-09-25 DIAGNOSIS — Z885 Allergy status to narcotic agent status: Secondary | ICD-10-CM | POA: Insufficient documentation

## 2014-09-25 DIAGNOSIS — M532X6 Spinal instabilities, lumbar region: Secondary | ICD-10-CM

## 2014-09-25 DIAGNOSIS — Y793 Surgical instruments, materials and orthopedic devices (including sutures) associated with adverse incidents: Secondary | ICD-10-CM | POA: Insufficient documentation

## 2014-09-25 DIAGNOSIS — T8489XA Other specified complication of internal orthopedic prosthetic devices, implants and grafts, initial encounter: Secondary | ICD-10-CM | POA: Diagnosis not present

## 2014-09-25 DIAGNOSIS — F17210 Nicotine dependence, cigarettes, uncomplicated: Secondary | ICD-10-CM | POA: Insufficient documentation

## 2014-09-25 DIAGNOSIS — E780 Pure hypercholesterolemia: Secondary | ICD-10-CM | POA: Insufficient documentation

## 2014-09-25 DIAGNOSIS — Z981 Arthrodesis status: Secondary | ICD-10-CM

## 2014-09-25 DIAGNOSIS — F419 Anxiety disorder, unspecified: Secondary | ICD-10-CM | POA: Insufficient documentation

## 2014-09-25 HISTORY — PX: OTHER SURGICAL HISTORY: SHX169

## 2014-09-25 HISTORY — DX: Reserved for inherently not codable concepts without codable children: IMO0001

## 2014-09-25 SURGERY — POSTERIOR LUMBAR FUSION 3 WITH HARDWARE REMOVAL
Anesthesia: General

## 2014-09-25 MED ORDER — THROMBIN 20000 UNITS EX SOLR
CUTANEOUS | Status: DC | PRN
  Administered 2014-09-25: 14:00:00 via TOPICAL

## 2014-09-25 MED ORDER — METOCLOPRAMIDE HCL 5 MG/ML IJ SOLN: 5.0000 mg | Freq: Three times a day (TID) | INTRAMUSCULAR | Status: DC | PRN

## 2014-09-25 MED ORDER — OXYCODONE-ACETAMINOPHEN 5-325 MG PO TABS
1.0000 | ORAL_TABLET | Freq: Four times a day (QID) | ORAL | Status: DC | PRN
  Administered 2014-09-25: 1 via ORAL
  Filled 2014-09-25: qty 1

## 2014-09-25 MED ORDER — QUETIAPINE FUMARATE 400 MG PO TABS
700.0000 mg | ORAL_TABLET | Freq: Every day | ORAL | Status: DC
  Administered 2014-09-25 – 2014-09-26 (×2): 700 mg via ORAL
  Filled 2014-09-25 (×3): qty 1

## 2014-09-25 MED ORDER — LACTATED RINGERS IV SOLN
INTRAVENOUS | Status: DC | PRN
  Administered 2014-09-25 (×2): via INTRAVENOUS

## 2014-09-25 MED ORDER — LACTATED RINGERS IV SOLN
INTRAVENOUS | Status: DC
  Administered 2014-09-25: 12:00:00 via INTRAVENOUS

## 2014-09-25 MED ORDER — DOCUSATE SODIUM 100 MG PO CAPS: 100.0000 mg | ORAL_CAPSULE | Freq: Two times a day (BID) | ORAL | Status: DC | PRN

## 2014-09-25 MED ORDER — ACETAMINOPHEN 10 MG/ML IV SOLN
INTRAVENOUS | Status: DC | PRN
  Administered 2014-09-25: 1000 mg via INTRAVENOUS

## 2014-09-25 MED ORDER — ONDANSETRON HCL 4 MG PO TABS: 4.0000 mg | ORAL_TABLET | Freq: Four times a day (QID) | ORAL | Status: DC | PRN

## 2014-09-25 MED ORDER — ONDANSETRON HCL 4 MG/2ML IJ SOLN
INTRAMUSCULAR | Status: DC | PRN
  Administered 2014-09-25: 4 mg via INTRAVENOUS

## 2014-09-25 MED ORDER — ONDANSETRON HCL 4 MG/2ML IJ SOLN
INTRAMUSCULAR | Status: AC
  Filled 2014-09-25: qty 2

## 2014-09-25 MED ORDER — POLYETHYLENE GLYCOL 3350 17 G PO PACK: 17.0000 g | PACK | Freq: Every day | ORAL | Status: DC | PRN

## 2014-09-25 MED ORDER — ONDANSETRON HCL 4 MG/2ML IJ SOLN: 4.0000 mg | Freq: Four times a day (QID) | INTRAMUSCULAR | Status: DC | PRN

## 2014-09-25 MED ORDER — THROMBIN 20000 UNITS EX SOLR
CUTANEOUS | Status: AC
  Filled 2014-09-25: qty 20000

## 2014-09-25 MED ORDER — ALPRAZOLAM 0.5 MG PO TABS
2.0000 mg | ORAL_TABLET | Freq: Three times a day (TID) | ORAL | Status: DC
  Administered 2014-09-25 – 2014-09-27 (×5): 2 mg via ORAL
  Filled 2014-09-25 (×2): qty 4
  Filled 2014-09-25: qty 8
  Filled 2014-09-25 (×2): qty 4

## 2014-09-25 MED ORDER — ROCURONIUM BROMIDE 50 MG/5ML IV SOLN
INTRAVENOUS | Status: AC
  Filled 2014-09-25: qty 1

## 2014-09-25 MED ORDER — 0.9 % SODIUM CHLORIDE (POUR BTL) OPTIME
TOPICAL | Status: DC | PRN
  Administered 2014-09-25: 1000 mL

## 2014-09-25 MED ORDER — POTASSIUM CHLORIDE IN NACL 20-0.45 MEQ/L-% IV SOLN
INTRAVENOUS | Status: DC
  Administered 2014-09-25 – 2014-09-27 (×3): via INTRAVENOUS
  Filled 2014-09-25 (×6): qty 1000

## 2014-09-25 MED ORDER — ROCURONIUM BROMIDE 100 MG/10ML IV SOLN
INTRAVENOUS | Status: DC | PRN
  Administered 2014-09-25: 10 mg via INTRAVENOUS
  Administered 2014-09-25: 40 mg via INTRAVENOUS

## 2014-09-25 MED ORDER — NEOSTIGMINE METHYLSULFATE 10 MG/10ML IV SOLN
INTRAVENOUS | Status: DC | PRN
  Administered 2014-09-25: 5 mg via INTRAVENOUS

## 2014-09-25 MED ORDER — MIDAZOLAM HCL 2 MG/2ML IJ SOLN
INTRAMUSCULAR | Status: AC
  Filled 2014-09-25: qty 2

## 2014-09-25 MED ORDER — OXYCODONE-ACETAMINOPHEN 10-325 MG PO TABS: 1.0000 | ORAL_TABLET | Freq: Four times a day (QID) | ORAL | Status: DC | PRN

## 2014-09-25 MED ORDER — FENTANYL CITRATE 0.05 MG/ML IJ SOLN
INTRAMUSCULAR | Status: DC | PRN
  Administered 2014-09-25: 50 ug via INTRAVENOUS
  Administered 2014-09-25 (×2): 100 ug via INTRAVENOUS

## 2014-09-25 MED ORDER — LIDOCAINE HCL (CARDIAC) 20 MG/ML IV SOLN
INTRAVENOUS | Status: DC | PRN
Start: 2014-09-25 — End: 2014-09-25
  Administered 2014-09-25: 100 mg via INTRAVENOUS

## 2014-09-25 MED ORDER — METHOCARBAMOL 500 MG PO TABS: 500.0000 mg | ORAL_TABLET | Freq: Four times a day (QID) | ORAL | Status: DC | PRN

## 2014-09-25 MED ORDER — BUPIVACAINE-EPINEPHRINE 0.25% -1:200000 IJ SOLN
INTRAMUSCULAR | Status: DC | PRN
  Administered 2014-09-25: 10 mL

## 2014-09-25 MED ORDER — CEFAZOLIN SODIUM 1-5 GM-% IV SOLN
1.0000 g | Freq: Three times a day (TID) | INTRAVENOUS | Status: AC
  Administered 2014-09-25 – 2014-09-26 (×3): 1 g via INTRAVENOUS
  Filled 2014-09-25 (×3): qty 50

## 2014-09-25 MED ORDER — LIDOCAINE HCL (CARDIAC) 20 MG/ML IV SOLN
INTRAVENOUS | Status: AC
  Filled 2014-09-25: qty 5

## 2014-09-25 MED ORDER — BUPIVACAINE-EPINEPHRINE (PF) 0.25% -1:200000 IJ SOLN
INTRAMUSCULAR | Status: AC
  Filled 2014-09-25: qty 30

## 2014-09-25 MED ORDER — PROPOFOL 10 MG/ML IV BOLUS
INTRAVENOUS | Status: AC
  Filled 2014-09-25: qty 20

## 2014-09-25 MED ORDER — PROPOFOL 10 MG/ML IV BOLUS
INTRAVENOUS | Status: DC | PRN
  Administered 2014-09-25: 50 mg via INTRAVENOUS
  Administered 2014-09-25: 200 mg via INTRAVENOUS

## 2014-09-25 MED ORDER — OXYCODONE HCL 5 MG PO TABS
5.0000 mg | ORAL_TABLET | Freq: Four times a day (QID) | ORAL | Status: DC | PRN
  Administered 2014-09-25: 5 mg via ORAL
  Filled 2014-09-25: qty 1

## 2014-09-25 MED ORDER — METHOCARBAMOL 750 MG PO TABS: 750.0000 mg | ORAL_TABLET | Freq: Four times a day (QID) | ORAL | Status: DC | PRN

## 2014-09-25 MED ORDER — MIDAZOLAM HCL 5 MG/5ML IJ SOLN
INTRAMUSCULAR | Status: DC | PRN
  Administered 2014-09-25: 2 mg via INTRAVENOUS

## 2014-09-25 MED ORDER — MORPHINE SULFATE 2 MG/ML IJ SOLN
2.0000 mg | INTRAMUSCULAR | Status: DC | PRN
  Administered 2014-09-25 – 2014-09-26 (×3): 2 mg via INTRAVENOUS
  Filled 2014-09-25 (×3): qty 1

## 2014-09-25 MED ORDER — ONDANSETRON 4 MG PO TBDP: 4.0000 mg | ORAL_TABLET | Freq: Three times a day (TID) | ORAL | Status: DC | PRN

## 2014-09-25 MED ORDER — METOCLOPRAMIDE HCL 10 MG PO TABS: 5.0000 mg | ORAL_TABLET | Freq: Three times a day (TID) | ORAL | Status: DC | PRN

## 2014-09-25 MED ORDER — FENTANYL CITRATE 0.05 MG/ML IJ SOLN
25.0000 ug | INTRAMUSCULAR | Status: DC | PRN
  Administered 2014-09-25 (×2): 25 ug via INTRAVENOUS
  Administered 2014-09-25: 50 ug via INTRAVENOUS

## 2014-09-25 MED ORDER — FENTANYL CITRATE 0.05 MG/ML IJ SOLN
INTRAMUSCULAR | Status: AC
  Filled 2014-09-25: qty 2

## 2014-09-25 MED ORDER — FENTANYL CITRATE 0.05 MG/ML IJ SOLN
INTRAMUSCULAR | Status: AC
  Filled 2014-09-25: qty 5

## 2014-09-25 MED ORDER — OXYCODONE-ACETAMINOPHEN 10-325 MG PO TABS
1.0000 | ORAL_TABLET | Freq: Four times a day (QID) | ORAL | Status: DC | PRN
Start: 2014-09-25 — End: 2014-09-25

## 2014-09-25 MED ORDER — DEXAMETHASONE SODIUM PHOSPHATE 10 MG/ML IJ SOLN
INTRAMUSCULAR | Status: DC | PRN
  Administered 2014-09-25: 10 mg via INTRAVENOUS

## 2014-09-25 MED ORDER — GLYCOPYRROLATE 0.2 MG/ML IJ SOLN
INTRAMUSCULAR | Status: DC | PRN
  Administered 2014-09-25: .8 mg via INTRAVENOUS

## 2014-09-25 SURGICAL SUPPLY — 66 items
BLADE SURG ROTATE 9660 (MISCELLANEOUS) IMPLANT
CLOSURE STERI-STRIP 1/2X4 (GAUZE/BANDAGES/DRESSINGS) ×1
CLSR STERI-STRIP ANTIMIC 1/2X4 (GAUZE/BANDAGES/DRESSINGS) ×2 IMPLANT
CORDS BIPOLAR (ELECTRODE) ×3 IMPLANT
COVER MAYO STAND STRL (DRAPES) ×6 IMPLANT
COVER SURGICAL LIGHT HANDLE (MISCELLANEOUS) ×3 IMPLANT
DRAPE C-ARM 42X72 X-RAY (DRAPES) ×6 IMPLANT
DRAPE POUCH INSTRU U-SHP 10X18 (DRAPES) ×3 IMPLANT
DRAPE SURG 17X23 STRL (DRAPES) ×3 IMPLANT
DRAPE U-SHAPE 47X51 STRL (DRAPES) ×3 IMPLANT
DRSG ADAPTIC 3X8 NADH LF (GAUZE/BANDAGES/DRESSINGS) ×3 IMPLANT
DRSG MEPILEX BORDER 4X8 (GAUZE/BANDAGES/DRESSINGS) ×3 IMPLANT
DRSG OPSITE 6X11 MED (GAUZE/BANDAGES/DRESSINGS) ×3 IMPLANT
DURAPREP 26ML APPLICATOR (WOUND CARE) ×3 IMPLANT
ELECT BLADE 4.0 EZ CLEAN MEGAD (MISCELLANEOUS)
ELECT BLADE 6.5 EXT (BLADE) ×3 IMPLANT
ELECT CAUTERY BLADE 6.4 (BLADE) ×3 IMPLANT
ELECT PENCIL ROCKER SW 15FT (MISCELLANEOUS) ×3 IMPLANT
ELECT REM PT RETURN 9FT ADLT (ELECTROSURGICAL) ×3
ELECTRODE BLDE 4.0 EZ CLN MEGD (MISCELLANEOUS) IMPLANT
ELECTRODE REM PT RTRN 9FT ADLT (ELECTROSURGICAL) ×1 IMPLANT
FLOSEAL 10ML (HEMOSTASIS) IMPLANT
GLOVE BIOGEL PI IND STRL 6.5 (GLOVE) ×2 IMPLANT
GLOVE BIOGEL PI IND STRL 8 (GLOVE) ×1 IMPLANT
GLOVE BIOGEL PI IND STRL 8.5 (GLOVE) ×1 IMPLANT
GLOVE BIOGEL PI INDICATOR 6.5 (GLOVE) ×4
GLOVE BIOGEL PI INDICATOR 8 (GLOVE) ×2
GLOVE BIOGEL PI INDICATOR 8.5 (GLOVE) ×2
GLOVE ECLIPSE 8.5 STRL (GLOVE) ×3 IMPLANT
GLOVE ORTHO TXT STRL SZ7.5 (GLOVE) ×3 IMPLANT
GOWN STRL REUS W/ TWL LRG LVL3 (GOWN DISPOSABLE) ×1 IMPLANT
GOWN STRL REUS W/TWL 2XL LVL3 (GOWN DISPOSABLE) ×6 IMPLANT
GOWN STRL REUS W/TWL LRG LVL3 (GOWN DISPOSABLE) ×2
GUIDEWIRE NITINOL BEVEL TIP (WIRE) ×3 IMPLANT
KIT BASIN OR (CUSTOM PROCEDURE TRAY) ×3 IMPLANT
KIT POSITION SURG JACKSON T1 (MISCELLANEOUS) ×3 IMPLANT
KIT ROOM TURNOVER OR (KITS) ×3 IMPLANT
NEEDLE 22X1 1/2 (OR ONLY) (NEEDLE) ×3 IMPLANT
NEEDLE SPNL 18GX3.5 QUINCKE PK (NEEDLE) ×3 IMPLANT
NS IRRIG 1000ML POUR BTL (IV SOLUTION) ×3 IMPLANT
PACK LAMINECTOMY ORTHO (CUSTOM PROCEDURE TRAY) ×3 IMPLANT
PACK UNIVERSAL I (CUSTOM PROCEDURE TRAY) ×3 IMPLANT
PAD ARMBOARD 7.5X6 YLW CONV (MISCELLANEOUS) ×6 IMPLANT
PATTIES SURGICAL .5 X.5 (GAUZE/BANDAGES/DRESSINGS) IMPLANT
PATTIES SURGICAL .5 X1 (DISPOSABLE) IMPLANT
ROD RELINE LORDOTIC 5.5X140 (Rod) ×3 IMPLANT
SCREW LOCK RELINE 5.5 TULIP (Screw) ×6 IMPLANT
SCREW RELINE MAS POLY 7.5X45MM (Screw) ×3 IMPLANT
SPONGE LAP 4X18 X RAY DECT (DISPOSABLE) ×6 IMPLANT
SPONGE SURGIFOAM ABS GEL 100 (HEMOSTASIS) ×3 IMPLANT
SURGIFLO TRUKIT (HEMOSTASIS) ×3 IMPLANT
SUT BONE WAX W31G (SUTURE) ×3 IMPLANT
SUT MON AB 3-0 SH 27 (SUTURE) ×2
SUT MON AB 3-0 SH27 (SUTURE) ×1 IMPLANT
SUT VIC AB 0 CTB1 27 (SUTURE) ×6 IMPLANT
SUT VIC AB 1 CTX 18 (SUTURE) ×3 IMPLANT
SUT VIC AB 1 CTX 36 (SUTURE) ×4
SUT VIC AB 1 CTX36XBRD ANBCTR (SUTURE) ×2 IMPLANT
SUT VIC AB 2-0 CT1 18 (SUTURE) ×3 IMPLANT
SYR BULB IRRIGATION 50ML (SYRINGE) ×3 IMPLANT
SYR CONTROL 10ML LL (SYRINGE) ×3 IMPLANT
TOWEL OR 17X24 6PK STRL BLUE (TOWEL DISPOSABLE) ×3 IMPLANT
TOWEL OR 17X26 10 PK STRL BLUE (TOWEL DISPOSABLE) ×3 IMPLANT
TRAY FOLEY CATH 16FRSI W/METER (SET/KITS/TRAYS/PACK) IMPLANT
WATER STERILE IRR 1000ML POUR (IV SOLUTION) ×3 IMPLANT
YANKAUER SUCT BULB TIP NO VENT (SUCTIONS) ×3 IMPLANT

## 2014-09-25 NOTE — Anesthesia Preprocedure Evaluation (Signed)
Anesthesia Evaluation  Patient identified by MRN, date of birth, ID band Patient awake    Airway Mallampati: II       Dental   Pulmonary shortness of breath, Current Smoker,          Cardiovascular negative cardio ROS  Rhythm:Regular Rate:Normal     Neuro/Psych    GI/Hepatic negative GI ROS, Neg liver ROS,   Endo/Other    Renal/GU negative Renal ROS     Musculoskeletal   Abdominal   Peds  Hematology negative hematology ROS (+)   Anesthesia Other Findings   Reproductive/Obstetrics                             Anesthesia Physical Anesthesia Plan  ASA: II  Anesthesia Plan: General   Post-op Pain Management:    Induction: Intravenous  Airway Management Planned: Oral ETT  Additional Equipment:   Intra-op Plan:   Post-operative Plan: Extubation in OR  Informed Consent: I have reviewed the patients History and Physical, chart, labs and discussed the procedure including the risks, benefits and alternatives for the proposed anesthesia with the patient or authorized representative who has indicated his/her understanding and acceptance.   Dental advisory given  Plan Discussed with: CRNA and Anesthesiologist  Anesthesia Plan Comments:         Anesthesia Quick Evaluation

## 2014-09-25 NOTE — Progress Notes (Signed)
Called Zonia KiefJames Owens PA regarding patient screaming and ranting in pain upon arrival to floor from PACU. Pt inconsolable. Given PO pain medication at 1850 and patient refusing to calm down and wait for medication to work. James increased robaxin to 750mg  but no orders given to increase pain medicine. Will give patient psych meds an hour early as psych issues are present.

## 2014-09-25 NOTE — Brief Op Note (Signed)
09/25/2014  6:07 PM  PATIENT:  Deanna ArtisWilliam H Gusman  23 y.o. male  PRE-OPERATIVE DIAGNOSIS:  hardware failure  POST-OPERATIVE DIAGNOSIS:  hardware failure  PROCEDURE:  Procedure(s): REVISION OF PEDICLE SCREW CONSTRAINT LOWER BACK (N/A)  SURGEON:  Surgeon(s) and Role:    * Venita Lickahari Angalena Cousineau, MD - Primary  PHYSICIAN ASSISTANT:   ASSISTANTS: james owens    ANESTHESIA:   general  EBL:  Total I/O In: 1700 [I.V.:1700] Out: 50 [Blood:50]  BLOOD ADMINISTERED:none  DRAINS: none   LOCAL MEDICATIONS USED:  MARCAINE     SPECIMEN:  No Specimen  DISPOSITION OF SPECIMEN:  N/A  COUNTS:  YES  TOURNIQUET:  * No tourniquets in log *  DICTATION: .Other Dictation: Dictation Number [id number]  PLAN OF CARE: Admit for overnight observation  PATIENT DISPOSITION:  PACU - hemodynamically stable.

## 2014-09-25 NOTE — Anesthesia Postprocedure Evaluation (Signed)
  Anesthesia Post-op Note  Patient: Daniel ArtisWilliam H [name]  Procedure(s) Performed: Procedure(s): REVISION OF PEDICLE SCREW CONSTRAINT LOWER BACK (N/A)  Patient Location: PACU  Anesthesia Type:General  Level of Consciousness: awake  Airway and Oxygen Therapy: Patient Spontanous Breathing  Post-op Pain: mild  Post-op Assessment: Post-op Vital signs reviewed  Post-op Vital Signs: Reviewed  Last Vitals:  Filed Vitals:   09/25/14 1602  BP:   Pulse: 63  Temp:   Resp: 9    Complications: No apparent anesthesia complications

## 2014-09-25 NOTE — Transfer of Care (Signed)
Immediate Anesthesia Transfer of Care Note  Patient: Daniel [name]  Procedure(s) Performed: Procedure(s): REVISION OF PEDICLE SCREW CONSTRAINT LOWER BACK (N/A)  Patient Location: PACU  Anesthesia Type:General  Level of Consciousness: awake, alert , oriented and patient cooperative  Airway & Oxygen Therapy: Patient Spontanous Breathing and Patient connected to nasal cannula oxygen  Post-op Assessment: Report given to PACU RN, Post -op Vital signs reviewed and stable and Patient moving all extremities  Post vital signs: Reviewed and stable  Complications: No apparent anesthesia complications

## 2014-09-25 NOTE — H&P (Signed)
Agree with H+P Patient with hardware change Initial post-op xrays demonstrated satisfactory hardware position Subsequent xrays demonstrated rod-screw dissociation. Discussed with patient To provide satisfactory stability during healing process recommend revising hardware on the right side All risks reviewed  -  Death, stroke, paralysis, failure to heal, need for further surgery, ongoing or worse pain, hardware complications.

## 2014-09-25 NOTE — Progress Notes (Signed)
09/25/2014 9:09 PM  Checked on patient and he was crying and screaming that he was in pain, and that he did not want pills because they did not help him. I made him aware that he now had an order for morphine and that I would get it to him as soon as pharmacy approves the medication. Will give patient psych meds and pain medication.

## 2014-09-25 NOTE — Plan of Care (Signed)
Problem: Consults Goal: Diagnosis - Spinal Surgery Microdiscectomy     

## 2014-09-25 NOTE — Progress Notes (Signed)
09/25/2014 9:06 PM  Pt's mother called and asked how her son was doing. I let her know that he was offered pain medication and that he refused.He is very anxious. I asked did she mind coming to sit with him tonight. She informed me that she felt she made it worse by her being there so that's why she left earlier. She gave me her number and said to call her back any time of the night, if his actions became worse.   Misty StanleyLisa (mom) [phone]

## 2014-09-25 NOTE — Progress Notes (Signed)
8:59 PM 09/25/2014  Called Daniel KiefJames Owens PA regarding patient crying and ranting in pain and about wanting to get out of bed. Pts mother took back brace home. Spoke with patient about plan of care and Pt inconsolable. Given by telephone readback and order for Morphine 2mg  q3h per Daniel KiefJames Owens, PA. Orders given that pt can only go from the bed to the chair. Pt refuses pain medication and stated " We do not care about his pain, so just rate his pain a 1. Will continue to monitor.

## 2014-09-26 ENCOUNTER — Encounter (HOSPITAL_COMMUNITY): Payer: Self-pay | Admitting: General Practice

## 2014-09-26 DIAGNOSIS — T8489XA Other specified complication of internal orthopedic prosthetic devices, implants and grafts, initial encounter: Secondary | ICD-10-CM | POA: Diagnosis not present

## 2014-09-26 MED ORDER — PNEUMOCOCCAL VAC POLYVALENT 25 MCG/0.5ML IJ INJ
0.5000 mL | INJECTION | INTRAMUSCULAR | Status: AC
  Administered 2014-09-27: 0.5 mL via INTRAMUSCULAR
  Filled 2014-09-26 (×2): qty 0.5

## 2014-09-26 MED ORDER — INFLUENZA VAC SPLIT QUAD 0.5 ML IM SUSY
0.5000 mL | PREFILLED_SYRINGE | INTRAMUSCULAR | Status: AC
  Administered 2014-09-27: 0.5 mL via INTRAMUSCULAR
  Filled 2014-09-26: qty 0.5

## 2014-09-26 MED ORDER — DIAZEPAM 5 MG PO TABS
5.0000 mg | ORAL_TABLET | Freq: Four times a day (QID) | ORAL | Status: DC | PRN
Start: 2014-09-26 — End: 2014-09-27
  Administered 2014-09-26 – 2014-09-27 (×3): 5 mg via ORAL
  Filled 2014-09-26 (×3): qty 1

## 2014-09-26 MED ORDER — HYDROMORPHONE HCL 1 MG/ML IJ SOLN: 0.5000 mg | INTRAMUSCULAR | Status: DC | PRN

## 2014-09-26 MED ORDER — HYDROMORPHONE HCL 2 MG PO TABS
2.0000 mg | ORAL_TABLET | ORAL | Status: DC | PRN
  Administered 2014-09-26 – 2014-09-27 (×8): 4 mg via ORAL
  Filled 2014-09-26 (×8): qty 2

## 2014-09-26 NOTE — Progress Notes (Signed)
UR completed 

## 2014-09-26 NOTE — Evaluation (Signed)
Physical Therapy Evaluation Patient Details Name: Daniel ArtisWilliam H Honeyman MRN: [id number] DOB: [birth year] Today's Date: 09/26/2014   History of Present Illness  23 y.o. male s/p Revision of right pedicle screw rod construct from previous L1 for instrumented fusion  Clinical Impression  Patient is seen following the above procedure and presents with functional limitations due to the deficits listed below (see PT Problem List). Pt reports significant pain and was unable to ambulate without an assistive device today. States he will not be able to fit a RW in his home; will practice with cane at following therapy session. Safely completed stair training today. Reviewed safety with mobility and now has TLSO from home. Patient will benefit from skilled PT to increase their independence and safety with mobility to allow discharge to the venue listed below.       Follow Up Recommendations No PT follow up (outpatient follow up when cleared by MD)    Equipment Recommendations  3in1 (PT);Other (comment) (tub bench)    Recommendations for Other Services OT consult     Precautions / Restrictions Precautions Precautions: Back Precaution Booklet Issued: Yes (comment) Precaution Comments: Reviewed back precautions Required Braces or Orthoses: Spinal Brace Spinal Brace: Thoracolumbosacral orthotic Restrictions Weight Bearing Restrictions: No      Mobility  Bed Mobility                  Transfers Overall transfer level: Needs assistance Equipment used: None Transfers: Sit to/from Stand Sit to Stand: Min guard         General transfer comment: min guard for safety. VC for technique and hand placement. Slow to rise and complains of increased pain.  Ambulation/Gait Ambulation/Gait assistance: Supervision Ambulation Distance (Feet): 400 Feet Assistive device: Rolling walker (2 wheeled) Gait Pattern/deviations: Step-through pattern;Decreased stride length;Wide base of support;Trunk  flexed   Gait velocity interpretation: Below normal speed for age/gender General Gait Details: Slow and guarded. Required frequent VC for upright posture. Pt unwilling to ambulate without RW for support due to pain (did not use an assistive device prior to this surgery). VC for increased stride length. No loss of balance noted and has good control of RW.  Stairs Stairs: Yes Stairs assistance: Supervision Stair Management: One rail Left;Step to pattern;Sideways Number of Stairs: 2 General stair comments: Educated on safe stair navigation with cues for sequencing and technique for sideways approach. pt demonstrates safe ability to complete this task and reports he is confident with his ability.  Wheelchair Mobility    Modified Rankin (Stroke Patients Only)       Balance Overall balance assessment: Needs assistance Sitting-balance support: No upper extremity supported;Feet supported Sitting balance-Leahy Scale: Fair     Standing balance support: No upper extremity supported Standing balance-Leahy Scale: Fair                               Pertinent Vitals/Pain Pain Assessment: 0-10 Pain Score: 8  Pain Location: back Pain Descriptors / Indicators: Burning Pain Intervention(s): Monitored during session;Repositioned;Premedicated before session;Limited activity within patient's tolerance    Home Living Family/patient expects to be discharged to:: Private residence Living Arrangements: Parent Available Help at Discharge: Family;Available 24 hours/day Type of Home: House Home Access: Stairs to enter Entrance Stairs-Rails: Left Entrance Stairs-Number of Steps: 2 Home Layout: Two level Home Equipment: None      Prior Function Level of Independence: Independent  Hand Dominance   Dominant Hand: Right    Extremity/Trunk Assessment   Upper Extremity Assessment: Defer to OT evaluation           Lower Extremity Assessment: Overall WFL for  tasks assessed (normal sensation bil.)         Communication   Communication: No difficulties  Cognition Arousal/Alertness: Awake/alert Behavior During Therapy: WFL for tasks assessed/performed Overall Cognitive Status: Within Functional Limits for tasks assessed                      General Comments General comments (skin integrity, edema, etc.): Reviewed donning of TLSO. Needed some assist to don. Will have family assist at home as needed. Discussed back precautions in detail and self care at home for safe mobility and positioning for comfort and neutral back alignment. unsure if he can use a walker within his home due to limited space.     Exercises        Assessment/Plan    PT Assessment Patient needs continued PT services  PT Diagnosis Abnormality of gait;Acute pain   PT Problem List Decreased strength;Decreased activity tolerance;Decreased balance;Decreased mobility;Decreased range of motion;Decreased knowledge of precautions;Pain;Obesity  PT Treatment Interventions DME instruction;Gait training;Stair training;Functional mobility training;Therapeutic activities;Therapeutic exercise;Balance training;Neuromuscular re-education;Patient/family education;Modalities   PT Goals (Current goals can be found in the Care Plan section) Acute Rehab PT Goals Patient Stated Goal: No pain PT Goal Formulation: With patient Time For Goal Achievement: 10/03/14 Potential to Achieve Goals: Good    Frequency Min 5X/week   Barriers to discharge        Co-evaluation               End of Session Equipment Utilized During Treatment: Gait belt;Back brace Activity Tolerance: Patient tolerated treatment well Patient left: in chair;with call bell/phone within reach Nurse Communication: Mobility status    Functional Assessment Tool Used: clinical observation Functional Limitation: Mobility: Walking and moving around Mobility: Walking and Moving Around Current Status ([id number](G8978): At  least 1 percent but less than 20 percent impaired, limited or restricted Mobility: Walking and Moving Around Goal Status [phone](G8979): 0 percent impaired, limited or restricted    Time: [id number] PT Time Calculation (min) (ACUTE ONLY): 36 min   Charges:   PT Evaluation $Initial PT Evaluation Tier I: 1 Procedure PT Treatments $Gait Training: 8-22 mins $Self Care/Home Management: 8-22   PT G Codes:   Functional Assessment Tool Used: clinical observation Functional Limitation: Mobility: Walking and moving around    [place]erton MountBarbour, Teshia Mahone S 09/26/2014, 12:13 PM  Sunday SpillersLogan Secor Eugenio SaenzBarbour, [place] [phone]

## 2014-09-26 NOTE — Op Note (Signed)
NAME:  Marguerita MerlesGROOMS, Landynn              ACCOUNT NO.:  [id number]  MEDICAL RECORD NO.:  [id number]  LOCATION:  [id number]                        FACILITY:  [facility]  PHYSICIAN:  Alvy Bealahari D Mozell Haber, MD    DATE OF BIRTH:  02-19-1991  DATE OF PROCEDURE:  09/25/2014 DATE OF DISCHARGE:                              OPERATIVE REPORT   PREOPERATIVE DIAGNOSIS:  Symptomatic hardware.  POSTOPERATIVE DIAGNOSIS:  Symptomatic hardware.  OPERATIVE PROCEDURE:  Revision of right pedicle screw rod construct from previous L1 for instrumented fusion.  HISTORY:  This is a very pleasant 23 year old gentleman who had an L2-L3 compression fracture that ultimately failed brace therapy.  He was taken to the operating room about 6 weeks ago and he had a posterior instrumented fusion with MIS pedicle screws placed at L1 and L4, tacked with an internal brace.  He also had a kyphoplasty of the L2 level.  The patient did well and then on his 5-week postop visit, he was noted to have displacement of the rod.  As a result of the failure of the right L4 rod construct, we took him back to the operating room to revise this.  FIRST ASSISTANT:  Genene ChurnJames M. Denton Meekwens, P.A.  OPERATIVE NOTE:  The patient was brought to the operating room, placed supine on the operating table.  After successful induction of general anesthesia and endotracheal intubation, TEDs and SCDs were applied.  He was turned prone onto the spine frame.  All bony prominences were well padded and the back was prepped and draped in a standard fashion.  Time- out was taken confirming the patient, procedure and all other pertinent important data.  Once this was done, I then re-incised over the L1 and 4 pedicle through the previous right Wiltse incisions.  Sharp dissection was carried out down to the deep fascia.  Deep fascia was sharply incised and I could then palpate the rod.  The rod was superiorly displaced and the locking nut was still in the L4 pedicle screw.  At this  point, I removed the locking nut and then placed a guidepin through the pedicle and removed the pedicle screw itself.  I gave the pedicle screw to the rep to send for testing because it was unclear as to why the construct failed.  The locking nut was also noted to still be locked to that pedicle screw.  I needed the torque wrench in order to release the locking nut.  Essentially, the locking nut was still in place, the rod had just rotated free of the construct.  I then removed the locking nut from the L1 screw and then removed the rod.  I then obtained a new rod.  I cut this and I took a 140 rod and cut it to 130, so I knew I had enough space on either side.  I then re-seated the rod.  It should be noted at this time at L4, I placed a 7.5 diameter screw, same length. Again, I had excellent purchase.  The rod was then locked to the rod without difficulty.  I torqued all them again.  I irrigated both wounds and then closed in a layered fashion with interrupted #1 Vicryl sutures, 2-0  Vicryl sutures, and 3-0 Monocryl.  Steri-Strips and dry dressing were applied.  The patient was ultimately extubated, transferred to the PACU without incident.  At the end of the case, all needle and sponge counts were correct.  Because of the lateness of the hour, the patient will be admitted overnight and will be discharged to home tomorrow.  He will continue to use his TLSO brace for additional support and for comfort.  First assistant was Zonia KiefJames Owens, he was instrumental in assisting with retraction, visualization and wound closure.     Alvy Bealahari D Kaziyah Parkison, MD     DDB/MEDQ  D:  09/25/2014  T:  09/26/2014  Job:  [id number]

## 2014-09-26 NOTE — Progress Notes (Signed)
Patient noted crying on rounds with concerns that his pain wasn't control during the night and that he has not had any sleep. Per report, patient was catherized at 0130 AM for urinary retention. Bladder scanned greater than 600ml. Writer offered to stand patient up to voids. He appears agitated and moaning presently. I explained that he needs to either empty his bladder manually vice versa. He wasn't unable to voids at this time. 2nd in and out performed by Nurse Tech. Patient appears to have relief. MD present on the floor and aware of patient's pain. Issues addressed. Orders placed per MD. Patient is due to void at 1400. Will administered pain medication once verified.   Sim BoastHavy, RN

## 2014-09-26 NOTE — Plan of Care (Signed)
Problem: Consults Goal: Spinal Surgery Patient Education See Patient Education Module for education specifics.  Outcome: Completed/Met Date Met:  09/26/14 Goal: Diagnosis - Spinal Surgery Thoraco/Lumbar Spine Fusion  Problem: Phase I Progression Outcomes Goal: Log roll for position change Outcome: Completed/Met Date Met:  09/26/14 Goal: Initial discharge plan identified Outcome: Completed/Met Date Met:  09/26/14 Goal: PT/OT consults requested Outcome: Completed/Met Date Met:  09/26/14

## 2014-09-26 NOTE — Progress Notes (Signed)
Subjective: Patient complains of severe pain in his back. No leg pain, weakness or paresthesia. Says Percocet does not work for him   Objective: Vital signs in last 24 hours: Temp:  [97 F (36.1 C)-98.3 F (36.8 C)] 97.1 F (36.2 C) (11/26 0631) Pulse Rate:  [59-101] 60 (11/26 0631) Resp:  [6-23] 17 (11/26 0631) BP: (114-149)/(63-95) 116/76 mmHg (11/26 0631) SpO2:  [92 %-100 %] 95 % (11/26 0631) Weight:  [269 lb 11.2 oz (122.335 kg)] 269 lb 11.2 oz (122.335 kg) (11/25 1059)  Intake/Output from previous day: 11/25 0701 - 11/26 0700 In: 1700 [I.V.:1700] Out: 1250 [Urine:1200; Blood:50] Intake/Output this shift:     Recent Labs  09/24/14 1109  HGB 16.2    Recent Labs  09/24/14 1109  WBC 11.9*  RBC 5.66  HCT 47.9  PLT 350    Recent Labs  09/24/14 1109  NA 140  K 4.4  CL 100  CO2 23  BUN 10  CREATININE 1.27  GLUCOSE 106*  CALCIUM 10.3    Recent Labs  09/24/14 1109  INR 1.06    Neurologically intact Neurovascular intact wiggles toes and moves feet. Sensation intact BLE  Assessment/Plan: Lumbar hardware removal- Complains of severe pain. Has urinary retention which has been evaluated by Dr Shon BatonBrooks who felt it was not neurologic. No extremity weakness or pain. Have changed pain meds and muscle relaxant to see if that will provide relief. Will stay today and possibly discharge home tomorrow if improved.   Loanne DrillingALUISIO,Lamia Mariner V 09/26/2014, 8:21 AM

## 2014-09-27 DIAGNOSIS — T8489XA Other specified complication of internal orthopedic prosthetic devices, implants and grafts, initial encounter: Secondary | ICD-10-CM | POA: Diagnosis not present

## 2014-09-27 MED ORDER — DIAZEPAM 5 MG PO TABS: 5.0000 mg | ORAL_TABLET | Freq: Four times a day (QID) | ORAL | Status: DC | PRN

## 2014-09-27 MED ORDER — HYDROMORPHONE HCL 2 MG PO TABS: 2.0000 mg | ORAL_TABLET | ORAL | Status: DC | PRN

## 2014-09-27 NOTE — Progress Notes (Signed)
Physical Therapy Treatment Patient Details Name: Daniel [name] MRN: [id number] DOB: Oct 15, 1991 Today's Date: 09/27/2014    History of Present Illness 23 y.o. male s/p Revision of right pedicle screw rod construct from previous L1 for instrumented fusion    PT Comments    Pt. Overall at supervision for safety level with mobility.  States he will have his parents available for assist 24/7.  Progressing toward goals.  My DC later today.  Follow Up Recommendations  No PT follow up;Other (comment) (OP PT as indicated and when cleared by MD)     Equipment Recommendations  3in1 (PT);Other (comment) (tub bench)    Recommendations for Other Services       Precautions / Restrictions Precautions Precautions: Back Precaution Comments: Reviewed back precautions Required Braces or Orthoses: Spinal Brace Spinal Brace: Thoracolumbosacral orthotic;Other (comment) (in place upon PT arrival) Restrictions Weight Bearing Restrictions: No    Mobility  Bed Mobility Overal bed mobility:  (pt. presents in recliner chair)                Transfers Overall transfer level: Needs assistance Equipment used: Rolling walker (2 wheeled) Transfers: Sit to/from Stand Sit to Stand: Supervision         General transfer comment: supervision for safety and reminders for correct hand placement in rising to stand and in sitting down  Ambulation/Gait Ambulation/Gait assistance: Supervision Ambulation Distance (Feet): 400 Feet Assistive device: Rolling walker (2 wheeled) Gait Pattern/deviations: Step-through pattern;Decreased stride length;Trunk flexed Gait velocity: decreased   General Gait Details: Needed several verbal reminders for upright stance while walking.  Pt. requested to use RW and did not want to try cane.  Needed one reminder to keep hands on gray hand grips    Stairs Stairs:  (pt. declined)          Wheelchair Mobility    Modified Rankin (Stroke Patients Only)       Balance                                    Cognition Arousal/Alertness: Awake/alert Behavior During Therapy: WFL for tasks assessed/performed Overall Cognitive Status: Within Functional Limits for tasks assessed                      Exercises      General Comments        Pertinent Vitals/Pain Pain Assessment: 0-10 Pain Score: 8  Pain Location: back Pain Descriptors / Indicators: Burning Pain Intervention(s): Limited activity within patient's tolerance;Monitored during session;Repositioned;Premedicated before session    Home Living                      Prior Function            PT Goals (current goals can now be found in the care plan section) Progress towards PT goals: Progressing toward goals    Frequency  Min 5X/week    PT Plan Current plan remains appropriate    Co-evaluation             End of Session Equipment Utilized During Treatment: Gait belt;Back brace Activity Tolerance: Patient tolerated treatment well Patient left: in chair;with call bell/phone within reach     Time: 1327-1350 PT Time Calculation (min) (ACUTE ONLY): 23 min  Charges:  $Gait Training: 23-37 mins  G Codes:  Functional Assessment Tool Used: clinical observation Functional Limitation: Mobility: Walking and moving around Mobility: Walking and Moving Around Goal Status [phone]([id number]): 0 percent impaired, limited or restricted Mobility: Walking and Moving Around Discharge Status [phone](G8980): At least 1 percent but less than 20 percent impaired, limited or restricted   Ferman HammingBlankenship, Labella Zahradnik B 09/27/2014, 2:01 PM Weldon PickingSusan Estefania Kamiya PT Acute Rehab Services [phone] [address] [phone]

## 2014-09-27 NOTE — Progress Notes (Signed)
Patient ID: Daniel [name], male   DOB: 1991-05-12, 23 y.o.   MRN: [id number] Subjective: 2 Days Post-Op Procedure(s) (LRB): REVISION OF PEDICLE SCREW CONSTRAINT LOWER BACK (N/A)    Patient reports pain as moderate.  Pain seems to be better controlled currently and tolerating pain meds fine  Objective:   VITALS:   Filed Vitals:   09/27/14 0537  BP: 128/78  Pulse: 88  Temp: 98 F (36.7 C)  Resp: 18    Neurovascular intact Incision: dressing C/D/I  LABS  Recent Labs  09/24/14 1109  HGB 16.2  HCT 47.9  WBC 11.9*  PLT 350     Recent Labs  09/24/14 1109  NA 140  K 4.4  BUN 10  CREATININE 1.27  GLUCOSE 106*     Recent Labs  09/24/14 1109  INR 1.06     Assessment/Plan: 2 Days Post-Op Procedure(s) (LRB): REVISION OF PEDICLE SCREW CONSTRAINT LOWER BACK (N/A)   Advance diet Up with therapy Discharge home today if better with therapy  D/C foley

## 2014-09-27 NOTE — Plan of Care (Signed)
Problem: Phase I Progression Outcomes Goal: OOB as tolerated unless otherwise ordered Outcome: Completed/Met Date Met:  09/27/14  Problem: Phase II Progression Outcomes Goal: Tolerating diet Outcome: Completed/Met Date Met:  09/27/14 Goal: Verbalizes of donning/doffing brace Outcome: Completed/Met Date Met:  09/27/14

## 2014-10-03 ENCOUNTER — Encounter (HOSPITAL_COMMUNITY): Payer: Self-pay | Admitting: Orthopedic Surgery

## 2014-10-22 NOTE — Addendum Note (Signed)
Addendum  created 10/22/14 1738 by Judie Petitharlene Edwards, MD   Modules edited: Anesthesia Responsible Staff

## 2014-10-23 NOTE — Discharge Summary (Signed)
Patient ID: Daniel [name] MRN: [id number] DOB/AGE: Apr 28, 1991 23 y.o.  Admit date: 09/25/2014 Discharge date: 10/23/2014  Admission Diagnoses:  Active Problems:   S/P lumbar fusion   Discharge Diagnoses:  Active Problems:   S/P lumbar fusion  status post Procedure(s): REVISION OF PEDICLE SCREW CONSTRAINT LOWER BACK  Past Medical History  Diagnosis Date  . Fractures     L2   L3   . Anxiety     panic attacks  . Shortness of breath dyspnea     with severe pain    Surgeries: Procedure(s): REVISION OF PEDICLE SCREW CONSTRAINT LOWER BACK on 09/25/2014   Consultants:    Discharged Condition: Improved  Hospital Course: Daniel [name] is an 23 y.o. male who was admitted 09/25/2014 for operative treatment of hardware breakage. Patient failed conservative treatments (please see the history and physical for the specifics) and had severe unremitting pain that affects sleep, daily activities and work/hobbies. After pre-op clearance, the patient was taken to the operating room on 09/25/2014 and underwent  Procedure(s): REVISION OF PEDICLE SCREW CONSTRAINT LOWER BACK.    Patient was given perioperative antibiotics:  Anti-infectives    Start     Dose/Rate Route Frequency Ordered Stop   09/25/14 2200  ceFAZolin (ANCEF) IVPB 1 g/50 mL premix     1 g100 mL/hr over 30 Minutes Intravenous 3 times per day 09/25/14 1825 09/26/14 1314   09/24/14 1337  ceFAZolin (ANCEF) 3 g in dextrose 5 % 50 mL IVPB     3 g160 mL/hr over 30 Minutes Intravenous 30 min pre-op 09/24/14 1337 09/25/14 1401   09/24/14 1336  ceFAZolin (ANCEF) IVPB 2 g/50 mL premix  Status:  Discontinued     2 g100 mL/hr over 30 Minutes Intravenous 30 min pre-op 09/24/14 1337 09/24/14 1337       Patient was given sequential compression devices and early ambulation to prevent DVT.   Patient benefited maximally from hospital stay and there were no complications. At the time of discharge, the patient was urinating/moving  their bowels without difficulty, tolerating a regular diet, pain is controlled with oral pain medications and they have been cleared by PT/OT.   Recent vital signs: No data found.    Recent laboratory studies: No results for input(s): WBC, HGB, HCT, PLT, NA, K, CL, CO2, BUN, CREATININE, GLUCOSE, INR, CALCIUM in the last 72 hours.  Invalid input(s): PT, 2   Discharge Medications:     Medication List    STOP taking these medications        ibuprofen 800 MG tablet  Commonly known as:  ADVIL,MOTRIN      TAKE these medications        diazepam 5 MG tablet  Commonly known as:  VALIUM  Take 1 tablet (5 mg total) by mouth every 6 (six) hours as needed for anxiety or muscle spasms.     docusate sodium 100 MG capsule  Commonly known as:  COLACE  Take 1 capsule (100 mg total) by mouth 2 (two) times daily.     HYDROmorphone 2 MG tablet  Commonly known as:  DILAUDID  Take 1-2 tablets (2-4 mg total) by mouth every 3 (three) hours as needed for severe pain.     methocarbamol 500 MG tablet  Commonly known as:  ROBAXIN  Take 1 tablet (500 mg total) by mouth every 6 (six) hours as needed for muscle spasms.     ondansetron 4 MG disintegrating tablet  Commonly known as:  ZOFRAN ODT  Take 1 tablet (4 mg total) by mouth every 8 (eight) hours as needed.     oxyCODONE-acetaminophen 10-325 MG per tablet  Commonly known as:  PERCOCET  Take 1 tablet by mouth every 6 (six) hours as needed for pain.     polyethylene glycol packet  Commonly known as:  MIRALAX / GLYCOLAX  Take 17 g by mouth daily.     QUEtiapine 200 MG tablet  Commonly known as:  SEROQUEL  Take 700 mg by mouth at bedtime.     XANAX 2 MG tablet  Generic drug:  alprazolam  Take 2 mg by mouth 3 (three) times daily.        Diagnostic Studies: Dg Chest 2 View  09/24/2014   CLINICAL DATA:  Preop lumbar spine surgery  EXAM: CHEST  2 VIEW  COMPARISON:  None.  FINDINGS: The heart size and mediastinal contours are within normal  limits. Both lungs are clear.  Posterior lumbar fusion hardware is partially visualized. There are compression fractures of the L1 and L2 vertebral bodies.  IMPRESSION: No active cardiopulmonary disease.   Electronically Signed   By: Elige KoHetal  Patel   On: 09/24/2014 14:10   Dg Lumbar Spine Complete  09/25/2014   CLINICAL DATA:  Revision, failed spinal hardware  EXAM: DG C-ARM 61-120 MIN; LUMBAR SPINE - COMPLETE 4+ VIEW  FLUOROSCOPY TIME:  0 min, 36 seconds as reported by Dr. Shon BatonBrooks  COMPARISON:  Lumbar spine series of September 16, 2014.  FINDINGS: Four fluoro spot films are reviewed. There are posterior fusion rods traversing the L1-L4 levels. There are compressions of L2 and L3. L2 is been treated with kyphoplasty. The metallic rods and screws are intact.  IMPRESSION: Posterior fusion device revision by history. The fusion devices present at L1 and L4 are intact.   Electronically Signed   By: David  Swazil[place]Jordan   On: 09/25/2014 15:24   Dg C-arm 1-60 Min  09/25/2014   CLINICAL DATA:  Revision, failed spinal hardware  EXAM: DG C-ARM 61-120 MIN; LUMBAR SPINE - COMPLETE 4+ VIEW  FLUOROSCOPY TIME:  0 min, 36 seconds as reported by Dr. Shon BatonBrooks  COMPARISON:  Lumbar spine series of September 16, 2014.  FINDINGS: Four fluoro spot films are reviewed. There are posterior fusion rods traversing the L1-L4 levels. There are compressions of L2 and L3. L2 is been treated with kyphoplasty. The metallic rods and screws are intact.  IMPRESSION: Posterior fusion device revision by history. The fusion devices present at L1 and L4 are intact.   Electronically Signed   By: David  [place]   On: 09/25/2014 15:24        Discharge Instructions    Call MD / Call 911    Complete by:  As directed   If you experience chest pain or shortness of breath, CALL 911 and be transported to the hospital emergency room.  If you develope a fever above 101 F, pus (white drainage) or increased drainage or redness at the wound, or calf pain, call your  surgeon's office.     Call MD / Call 911    Complete by:  As directed   If you experience chest pain or shortness of breath, CALL 911 and be transported to the hospital emergency room.  If you develope a fever above 101 F, pus (white drainage) or increased drainage or redness at the wound, or calf pain, call your surgeon's office.     Constipation Prevention    Complete by:  As  directed   Drink plenty of fluids.  Prune juice may be helpful.  You may use a stool softener, such as Colace (over the counter) 100 mg twice a day.  Use MiraLax (over the counter) for constipation as needed.     Constipation Prevention    Complete by:  As directed   Drink plenty of fluids.  Prune juice may be helpful.  You may use a stool softener, such as Colace (over the counter) 100 mg twice a day.  Use MiraLax (over the counter) for constipation as needed.     Diet - low sodium heart healthy    Complete by:  As directed      Discharge instructions    Complete by:  As directed   Ok to shower 5 days postop.  Do not apply any creams or ointments to incision.  Do not remove steri-strips.  Can use 4x4 gauze and tape for dressing changes.  No aggressive activity.  No bending, squatting or prolonged sitting.  Mostly be in reclined position or lying down.  Must continue wearing TLSO brace.     Discharge instructions    Complete by:  As directed   Change dressing daily as needed to keep dry dressing until follow up with Shon BatonBrooks Keep wound dry     Driving restrictions    Complete by:  As directed   No driving until further notice.     Driving restrictions    Complete by:  As directed   No driving until further directed by Dr. Shon BatonBrooks     Increase activity slowly as tolerated    Complete by:  As directed      Increase activity slowly as tolerated    Complete by:  As directed      Lifting restrictions    Complete by:  As directed   No lifting until further notice.     Lifting restrictions    Complete by:  As directed     No lifting until directed by Dr. Shon BatonBrooks           Follow-up Information    Follow up with Alvy BealBROOKS,Darsh Vandevoort D, MD.   Specialty:  Orthopedic Surgery   Why:  need return office visit 2 weeks postop   Contact information:   [address] [place] [address] [phone]       Discharge Plan:  discharge to home  Disposition: Imaging demonstrated satisfactory hardware position.  Will f/u in 2 weeks for wound check    Signed: Venita LickBROOKS,Gayleen Sholtz D for Dr. Venita Lickahari Eathen [facility]au [facility] [phone] 10/23/2014, 8:37 AM

## 2014-10-23 NOTE — Discharge Summary (Signed)
Patient ID: Daniel ArtisWilliam H Andres MRN: [id number] DOB/AGE: 1991/08/12 23 y.o.  Admit date: 08/05/2014 Discharge date: 10/23/2014  Admission Diagnoses:  Active Problems:   Compression fracture   Lumbar compression fracture   Discharge Diagnoses:  Active Problems:   Compression fracture   Lumbar compression fracture  status post Procedure(s): L1 - L4 INSTRUMENTED FUSION AND L2 KYPHOPLASTY  Past Medical History  Diagnosis Date  . Fractures     L2   L3   . Anxiety     panic attacks  . Shortness of breath dyspnea     with severe pain    Surgeries: Procedure(s): L1 - L4 INSTRUMENTED FUSION AND L2 KYPHOPLASTY on 08/05/2014 - 08/07/2014   Consultants:    Discharged Condition: Improved  Hospital Course: Daniel [name] is an 23 y.o. male who was admitted 08/05/2014 for operative treatment of 2 level lumbar fracture. Patient failed conservative treatments (please see the history and physical for the specifics) and had severe unremitting pain that affects sleep, daily activities and work/hobbies. After pre-op clearance, the patient was taken to the operating room on 08/05/2014 - 08/07/2014 and underwent  Procedure(s): L1 - L4 INSTRUMENTED FUSION AND L2 KYPHOPLASTY.    Patient was given perioperative antibiotics:  Anti-infectives    Start     Dose/Rate Route Frequency Ordered Stop   08/07/14 2000  ceFAZolin (ANCEF) IVPB 1 g/50 mL premix     1 g100 mL/hr over 30 Minutes Intravenous Every 8 hours 08/07/14 1840 08/08/14 0430       Patient was given sequential compression devices and early ambulation to prevent DVT.   Patient benefited maximally from hospital stay and there were no complications. At the time of discharge, the patient was urinating/moving their bowels without difficulty, tolerating a regular diet, pain is controlled with oral pain medications and they have been cleared by PT/OT.   Recent vital signs: No data found.    Recent laboratory studies: No results for  input(s): WBC, HGB, HCT, PLT, NA, K, CL, CO2, BUN, CREATININE, GLUCOSE, INR, CALCIUM in the last 72 hours.  Invalid input(s): PT, 2   Discharge Medications:     Medication List    STOP taking these medications        chlorproMAZINE 200 MG tablet  Commonly known as:  THORAZINE     meloxicam 15 MG tablet  Commonly known as:  MOBIC     oxyCODONE-acetaminophen 10-325 MG per tablet  Commonly known as:  PERCOCET      TAKE these medications        docusate sodium 100 MG capsule  Commonly known as:  COLACE  Take 1 capsule (100 mg total) by mouth 2 (two) times daily.     ondansetron 4 MG disintegrating tablet  Commonly known as:  ZOFRAN ODT  Take 1 tablet (4 mg total) by mouth every 8 (eight) hours as needed.     polyethylene glycol packet  Commonly known as:  MIRALAX / GLYCOLAX  Take 17 g by mouth daily.     QUEtiapine 200 MG tablet  Commonly known as:  SEROQUEL  Take 700 mg by mouth at bedtime.     XANAX 2 MG tablet  Generic drug:  alprazolam  Take 2 mg by mouth 3 (three) times daily.        Diagnostic Studies: Dg Chest 2 View  09/24/2014   CLINICAL DATA:  Preop lumbar spine surgery  EXAM: CHEST  2 VIEW  COMPARISON:  None.  FINDINGS: The  heart size and mediastinal contours are within normal limits. Both lungs are clear.  Posterior lumbar fusion hardware is partially visualized. There are compression fractures of the L1 and L2 vertebral bodies.  IMPRESSION: No active cardiopulmonary disease.   Electronically Signed   By: Elige Ko   On: 09/24/2014 14:10   Dg Lumbar Spine Complete  09/25/2014   CLINICAL DATA:  Revision, failed spinal hardware  EXAM: DG C-ARM 61-120 MIN; LUMBAR SPINE - COMPLETE 4+ VIEW  FLUOROSCOPY TIME:  0 min, 36 seconds as reported by Dr. Shon Baton  COMPARISON:  Lumbar spine series of September 16, 2014.  FINDINGS: Four fluoro spot films are reviewed. There are posterior fusion rods traversing the L1-L4 levels. There are compressions of L2 and L3. L2 is  been treated with kyphoplasty. The metallic rods and screws are intact.  IMPRESSION: Posterior fusion device revision by history. The fusion devices present at L1 and L4 are intact.   Electronically Signed   By: David  [place]   On: 09/25/2014 15:24   Dg C-arm 1-60 Min  09/25/2014   CLINICAL DATA:  Revision, failed spinal hardware  EXAM: DG C-ARM 61-120 MIN; LUMBAR SPINE - COMPLETE 4+ VIEW  FLUOROSCOPY TIME:  0 min, 36 seconds as reported by Dr. Shon Baton  COMPARISON:  Lumbar spine series of September 16, 2014.  FINDINGS: Four fluoro spot films are reviewed. There are posterior fusion rods traversing the L1-L4 levels. There are compressions of L2 and L3. L2 is been treated with kyphoplasty. The metallic rods and screws are intact.  IMPRESSION: Posterior fusion device revision by history. The fusion devices present at L1 and L4 are intact.   Electronically Signed   By: David  [place]   On: 09/25/2014 15:24        Discharge Instructions    Call MD / Call 911    Complete by:  As directed   If you experience chest pain or shortness of breath, CALL 911 and be transported to the hospital emergency room.  If you develope a fever above 101 F, pus (white drainage) or increased drainage or redness at the wound, or calf pain, call your surgeon's office.     Constipation Prevention    Complete by:  As directed   Drink plenty of fluids.  Prune juice may be helpful.  You may use a stool softener, such as Colace (over the counter) 100 mg twice a day.  Use MiraLax (over the counter) for constipation as needed.     Diet - low sodium heart healthy    Complete by:  As directed      Discharge instructions    Complete by:  As directed   Ok to shower 5 days postop.  Do not apply any creams or ointments to incision.  Do not remove steri-strips.  Can use 4x4 gauze and tape for dressing changes.  No aggressive activity.  No bending, squatting or prolonged sitting.  Mostly be in reclined position or lying down.  Must wear brace  when up and ambulating.     Driving restrictions    Complete by:  As directed   No driving until further notice.     Increase activity slowly as tolerated    Complete by:  As directed      Lifting restrictions    Complete by:  As directed   No lifting until further notice.           Follow-up Information    Schedule an appointment as soon  as possible for a visit with Alvy BealBROOKS,Takiyah Bohnsack D, MD.   Specialty:  Orthopedic Surgery   Why:  need return office visit 2 weeks postop   Contact information:   [address] [place] [id number] [phone]       Discharge Plan:  discharge to home  Disposition: stable - doing well.  F/u in 2 weeks for wound check    Signed: Venita LickBROOKS,Blandon Offerdahl D for Dr. Venita Lickahari Angelito Hopping [facility] [phone] 10/23/2014, 8:39 AM

## 2014-12-17 ENCOUNTER — Ambulatory Visit (INDEPENDENT_AMBULATORY_CARE_PROVIDER_SITE_OTHER): Payer: Managed Care, Other (non HMO) | Admitting: Psychiatry

## 2014-12-17 ENCOUNTER — Telehealth (HOSPITAL_COMMUNITY): Payer: Self-pay

## 2014-12-17 ENCOUNTER — Encounter (INDEPENDENT_AMBULATORY_CARE_PROVIDER_SITE_OTHER): Payer: Self-pay

## 2014-12-17 ENCOUNTER — Encounter (HOSPITAL_COMMUNITY): Payer: Self-pay | Admitting: Psychiatry

## 2014-12-17 VITALS — BP 148/90 | HR 86 | Ht 75.0 in | Wt 275.0 lb

## 2014-12-17 DIAGNOSIS — F411 Generalized anxiety disorder: Secondary | ICD-10-CM

## 2014-12-17 DIAGNOSIS — G4700 Insomnia, unspecified: Secondary | ICD-10-CM

## 2014-12-17 DIAGNOSIS — F331 Major depressive disorder, recurrent, moderate: Secondary | ICD-10-CM

## 2014-12-17 DIAGNOSIS — F909 Attention-deficit hyperactivity disorder, unspecified type: Secondary | ICD-10-CM

## 2014-12-17 DIAGNOSIS — Z0000 Encounter for general adult medical examination without abnormal findings: Secondary | ICD-10-CM

## 2014-12-17 LAB — COMPLETE METABOLIC PANEL WITH GFR
ALT: 14 U/L (ref 0–53)
AST: 14 U/L (ref 0–37)
Albumin: 5 g/dL (ref 3.5–5.2)
Alkaline Phosphatase: 83 U/L (ref 39–117)
BUN: 5 mg/dL — AB (ref 6–23)
CALCIUM: 10.6 mg/dL — AB (ref 8.4–10.5)
CHLORIDE: 103 meq/L (ref 96–112)
CO2: 23 mEq/L (ref 19–32)
Creat: 0.97 mg/dL (ref 0.50–1.35)
GFR, Est African American: >89 mL/min
GLUCOSE: 89 mg/dL (ref 70–99)
Potassium: 4.1 mEq/L (ref 3.5–5.3)
Sodium: 139 mEq/L (ref 135–145)
Total Bilirubin: 0.4 mg/dL (ref 0.2–1.2)
Total Protein: 7.9 g/dL (ref 6.0–8.3)

## 2014-12-17 LAB — CBC
HCT: 45.8 % (ref 39.0–52.0)
Hemoglobin: 15.4 g/dL (ref 13.0–17.0)
MCH: 27.5 pg (ref 26.0–34.0)
MCHC: 33.6 g/dL (ref 30.0–36.0)
MCV: 81.8 fL (ref 78.0–100.0)
MPV: 10.4 fL (ref 8.6–12.4)
Platelets: 322 10*3/uL (ref 150–400)
RBC: 5.6 MIL/uL (ref 4.22–5.81)
RDW: 15.5 % (ref 11.5–15.5)
WBC: 11.5 10*3/uL — AB (ref 4.0–10.5)

## 2014-12-17 LAB — TSH: TSH: 1.814 u[IU]/mL (ref 0.350–4.500)

## 2014-12-17 MED ORDER — ZOLPIDEM TARTRATE ER 12.5 MG PO TBCR: 12.5000 mg | EXTENDED_RELEASE_TABLET | Freq: Every evening | ORAL | Status: AC | PRN

## 2014-12-17 MED ORDER — ALPRAZOLAM 2 MG PO TABS: 2.0000 mg | ORAL_TABLET | Freq: Three times a day (TID) | ORAL | Status: DC

## 2014-12-17 NOTE — Telephone Encounter (Signed)
Telephone call message from patient stating he could not afford $85 for the Ambien CR Dr. Michae KavaAgarwal ordered this date to assist with sleep.  Patient requested Dr. Michae KavaAgarwal review all past medications Dr. Otelia SanteeFarrah had him on in the past as states he signed a consent for those records when he came in today. Patient reported he has "severe ADHD" and is trying to track down school information to show all the medications he had tried in the past and why he needs for his ADHD to be treated.  States he cannot go back on Seroquel 1000mg  per day as gives him "heart palpitations".  States he needs something to help with sleep that can take the place of Seroquel.  Reports when he was in the 4th grade the school spent $1,000 on testing for ADHD and is concerned he may not be able to get his old records to verify.  States his parents can verify he had CAP/ADHD and needs to be back on stimulant medications to do well again.  Reports he was taken off stimulants when he was 24 years old and not sure why he was taken off of these. States he would have to ask his Mom about why he was taken off.  Reports never sleeping well since then.  Reports he was constantly in trouble in middle school and high school.  States he cannot keep relationships and is now 6825 with no improvements.  Reports he went to get ordered labs Dr. Michae KavaAgarwal requested today.  Reports no history of substance abuse or use and would like Dr. Michae KavaAgarwal to check wit Dr. Otelia SanteeFarrah about all the medications that have not worked for him in the past.  States Dr. Otelia SanteeFarrah ran out of things to try and left on good terms with Dr. Otelia SanteeFarrah.  Patient could not answer why Dr. Otelia SanteeFarrah never placed back on any stimulants but only reported he worked on all the family issues patient had been through.  Patient would like for drug results to be reviewed and call back about sleep.  States "I cannot go another 8 weeks without sleeping" and "I cannot go out of the house to do anything".  Patient scheduled  to return 02/04/15.  Requests call back prior to then with more instruction and what he can take to help with sleep?   Denied any current thoughts of wanting to harm himself or anyone else.  Patient will continue efforts attempting to track down verification or past medications.  Patient reports spending 14-15 hours a day in his room and just needs something to change and for Dr. Michae KavaAgarwal to work with him to help with sleep and to possibly consider medications for ADHD he was treated with in the past too.

## 2014-12-17 NOTE — Telephone Encounter (Signed)
Had release signed to obtain records from old psychiatrist Dr. Elmer Bales gave a copy of release. Went under Care Everywhere because Dr. Jake Samples is part of [facility]. Note below states note from Dr. Rella Larve office stating patient cancelled appointments and was not returning to see Dr. Jake Samples but still requesting appointment.  Telephone Encounter - Jveon, [place] R - 12/13/2014 11:36 AM EST No new rx, per Dr. Jake Samples since pt canceled apps & is not returning to see [place].    Patient had office visit today 12-17-2014 with Dr. Doyne Keel.   Patient also received RX from Dr. Doyne Keel for Xanax and Zolpidem today. Zolpidem required a prior authorization due to medication quantity I called insurance company and according to insurance carrier medication was filled elsewhere, met quantity limitation.  Per patient records below patient received a RX for Xanax and Belsomra on 11-25-2014.  ALPRAZolam (XANAX) 2 MG tablet Take 1 tablet (2 mg total) by mouth Three (3) times a day as needed for sleep. 90 tablet  2 11/25/2014  Active  suvorexant (BELSOMRA) 20 mg Tab Take 1 tablet by mouth nightly. 30 tablet  5 11/25/2014  Active           Patient already picked up Xanax that was sent this morning and I cancelled all the refills. This information was given to Dr. Doyne Keel and Dr. Doyne Keel will dismiss medication.
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
## 2014-12-17 NOTE — Telephone Encounter (Signed)
Received a fax from [facility] prior authorization needed for Zolpidem 12.5 mg tablets. Called [phone] and spoke with Rada Hay. Per [employer] medication is not covered because patient has met his quantity limit for the month, RX filled for this medication somewhere else. Called patient, patient stated he has not had the medication filled since 2013. I advised patient I will talk to Dr. Doyne Keel and we will call him back.  Patient verbalized understanding. Patient stated he did not want to hurt himself or anyone else.

## 2014-12-18 ENCOUNTER — Telehealth (HOSPITAL_COMMUNITY): Payer: Self-pay

## 2014-12-18 LAB — DRUG SCREEN, URINE
AMPHETAMINE SCRN UR: NEGATIVE
BARBITURATE QUANT UR: NEGATIVE
Benzodiazepines.: POSITIVE — AB
Cocaine Metabolites: NEGATIVE
Creatinine,U: 168.7 mg/dL
MARIJUANA METABOLITE: NEGATIVE
Methadone: NEGATIVE
OPIATES: NEGATIVE
Phencyclidine (PCP): NEGATIVE
Propoxyphene: NEGATIVE

## 2014-12-18 NOTE — Telephone Encounter (Signed)
Telephone call with patient to follow up on his messages left today questioning if Dr. Michae KavaAgarwal was going medications for ADHD and to inform he was still working on tracking down school records to verify past ADHD diagnosis.  Patient was questioning if Dr. Michae KavaAgarwal had received back his lab work and to verify his urine drug screen only showed positive for Benzodiazepines.  Patient reported being [facility] weeks back and denied taking more medications than prescribed.  Discussed plan for follow up on 12/19/14 and questioned if patient had ever considered intensive services such as CDIOP or IOP as patient reported "I don't have a drug problem though".  Informed patient Dr. Michae KavaAgarwal would be able to review returned lab results on 12/19/14 and this nurse or Dr. Michae KavaAgarwal would contact patient back about ongoing service plans and appropriate treatments which may be beneficial to patient.  Patient agreed with plan and stated he filled his medication at different pharmacy only because that is where he was in town but if Dr. Michae KavaAgarwal would like him to stick to one pharmacy for all refills he would be glad to do this too if needed.

## 2014-12-19 ENCOUNTER — Telehealth (HOSPITAL_COMMUNITY): Payer: Self-pay

## 2014-12-19 NOTE — Telephone Encounter (Signed)
Telephone call with patient with Dr. Michae KavaAgarwal and Ok AnisKelly Smith to discuss patient's status with options for continued services and concerns for possible history and recent misuse of controlled substances.  Discussed patients status with filling controlled substances over the past month from multiple providers and at different pharmacies.  Patient denied taking Ambien in "over a year" although his insurance company reports patient could not get Dr. Lemar LoftyAgarwal's order filled due to too early to fill since was already filled in the past month.  Patient stated he only used a different pharmacy this past week to fill prescribed Xanax as he was in a different neighborhood picking up something for his Mother.  Discussed options to continue to work with Dr. Michae KavaAgarwal with the understanding of no controlled substances would be prescribed for him but would attempt to manage symptoms and issues with other medication choices, we could refer patient to Chemical Dependence Intensive Outpatient services, or patient could change to another provider.  Patient admitted to flushing Xanax he had just filled 12/17/14 even after admitting this nurse requested he not do anything with medications so he could continue to show he was taking them as prescribed.  Patient began to get upset and reported "if something happens to me then oh well".  Patient would not answer questions related to concerns for his safety, if any thoughts to harm himself or others but just stated "yall don't care about me and I will just go until I can't".  Dr. Michae KavaAgarwal continued attempts to screen for safety due to patient's vague suicidal statements and would not contract for safety.  Dr. Michae KavaAgarwal informed patient we would be requesting a safety visit to his home due to statements he was making as he reported "I probably not be home".  Patient reported he did not want to continue to work with Dr. Michae KavaAgarwal and denied he had any type of chemical dependence as stated I was only  positive for the things prescribed but was also not receptive to stopping controlled substance and finding alternative medications to treat symptoms.  Sp[place]ie [place] to request a safety visit to patient's home and address and name given of patient due to safety concerns.  Ms. Fredric MareBailey reported a deputy of the [facility] [facility] would do a home visit to check on patient and would provide us a call back.  Phone numbers given for  this nurse and Dr. Michae KavaAgarwal.  Informed Dr. Michae KavaAgarwal and Ok AnisKelly Smith, CMA of plan.

## 2014-12-19 NOTE — Telephone Encounter (Signed)
During phone call provider expressed concern for patient multiple times. Explained that this provider was will to work with pt to manage anxiety and sleeps issues thru the use of therapy and medications that were no controlled substances. Pt declined and stated he no longer wanted to work with Dr. Michae KavaAgarwal. Pt made vague suicidal threats- "If something happens to be, oh well. If I don't make it then I don't make it". Pt was asked if he was suicidal and he refused to answer stating if he said yes then police would be called and he would be IVC.    We did contact the police for a welfare check on the patient.

## 2014-12-24 ENCOUNTER — Ambulatory Visit (HOSPITAL_COMMUNITY): Payer: Managed Care, Other (non HMO) | Admitting: Psychiatry

## 2015-01-09 ENCOUNTER — Other Ambulatory Visit: Payer: Self-pay | Admitting: Orthopedic Surgery

## 2015-01-09 DIAGNOSIS — Z4789 Encounter for other orthopedic aftercare: Secondary | ICD-10-CM

## 2015-02-04 ENCOUNTER — Ambulatory Visit (HOSPITAL_COMMUNITY): Payer: Self-pay | Admitting: Psychiatry

## 2015-02-10 ENCOUNTER — Ambulatory Visit (HOSPITAL_BASED_OUTPATIENT_CLINIC_OR_DEPARTMENT_OTHER): Payer: Managed Care, Other (non HMO) | Attending: Physical Medicine and Rehabilitation

## 2015-02-24 ENCOUNTER — Telehealth (HOSPITAL_COMMUNITY): Payer: Self-pay | Admitting: *Deleted

## 2015-02-24 NOTE — Telephone Encounter (Signed)
Patient called to make an appointment.  I advised patient I will have to talk to Dr. Michae KavaAgarwal about scheduling an appointment and she will be in tomorrow.  Patient stated he had a huge falling out with Dr. Michae KavaAgarwal and he is not interested in scheduling an appointment with her, patient stated he wants another doctor at our facility. I advised patient I would have to speak to the other doctors about him transferring care and Dr. Michae KavaAgarwal.  Patient hung off.

## 2015-04-18 ENCOUNTER — Encounter (HOSPITAL_COMMUNITY): Payer: Self-pay

## 2015-04-18 ENCOUNTER — Encounter (HOSPITAL_COMMUNITY)
Admission: RE | Admit: 2015-04-18 | Discharge: 2015-04-18 | Disposition: A | Discharge status: Home / Self Care | Payer: Managed Care, Other (non HMO) | Source: Ambulatory Visit | Attending: Orthopedic Surgery | Admitting: Orthopedic Surgery

## 2015-04-18 DIAGNOSIS — Z01812 Encounter for preprocedural laboratory examination: Secondary | ICD-10-CM | POA: Diagnosis not present

## 2015-04-18 HISTORY — DX: Headache, unspecified: R51.9

## 2015-04-18 HISTORY — DX: Other chronic pain: G89.29

## 2015-04-18 HISTORY — DX: Personal history of pneumonia (recurrent): Z87.01

## 2015-04-18 HISTORY — DX: Personal history of other diseases of the respiratory system: Z87.09

## 2015-04-18 HISTORY — DX: Dorsalgia, unspecified: M54.9

## 2015-04-18 HISTORY — DX: Panic disorder (episodic paroxysmal anxiety): F41.0

## 2015-04-18 HISTORY — DX: Migraine, unspecified, not intractable, without status migrainosus: G43.909

## 2015-04-18 HISTORY — DX: Gastro-esophageal reflux disease without esophagitis: K21.9

## 2015-04-18 HISTORY — DX: Headache: R51

## 2015-04-18 LAB — COMPREHENSIVE METABOLIC PANEL
ALK PHOS: 63 U/L (ref 38–126)
ALT: 17 U/L (ref 17–63)
ANION GAP: 8 (ref 5–15)
AST: 24 U/L (ref 15–41)
Albumin: 4.6 g/dL (ref 3.5–5.0)
BUN: 11 mg/dL (ref 6–20)
CO2: 26 mmol/L (ref 22–32)
Calcium: 10 mg/dL (ref 8.9–10.3)
Chloride: 104 mmol/L (ref 101–111)
Creatinine, Ser: 1.13 mg/dL (ref 0.61–1.24)
GLUCOSE: 100 mg/dL — AB (ref 65–99)
POTASSIUM: 4.2 mmol/L (ref 3.5–5.1)
Sodium: 138 mmol/L (ref 135–145)
Total Bilirubin: 0.9 mg/dL (ref 0.3–1.2)
Total Protein: 8 g/dL (ref 6.5–8.1)

## 2015-04-18 LAB — CBC
HCT: 45.3 % (ref 39.0–52.0)
Hemoglobin: 15.2 g/dL (ref 13.0–17.0)
MCH: 27.5 pg (ref 26.0–34.0)
MCHC: 33.6 g/dL (ref 30.0–36.0)
MCV: 82.1 fL (ref 78.0–100.0)
Platelets: 249 10*3/uL (ref 150–400)
RBC: 5.52 MIL/uL (ref 4.22–5.81)
RDW: 13.6 % (ref 11.5–15.5)
WBC: 9.2 10*3/uL (ref 4.0–10.5)

## 2015-04-18 LAB — SURGICAL PCR SCREEN
MRSA, PCR: NEGATIVE
Staphylococcus aureus: NEGATIVE

## 2015-04-18 NOTE — Pre-Procedure Instructions (Signed)
    Daniel [name]  04/18/2015      [facility] [address], [place] - [address] AT [facility] & [address] [place] [address] Phone: [phone] Fax: [phone]  [id number], [place] - [address] [place] [address] Phone: [phone] Fax: [phone]    Your procedure is scheduled on Thursday, June 30th, 2016 at 7:30.  Report to [facility] Admitting at 5:30 A.M.  Call this number if you have problems the morning of surgery:  [phone]   Remember:  Do not eat food or drink liquids after midnight.   Take these medicines the morning of surgery with A SIP OF WATER Alprazolam (Xanax), Carisoprodol (Soma), Diazepam (Valium), Docusate Sodium (Colace), Hydropmorphone (Dilaudid), Methocarbamol (Robaxin), Ondansetron (Zofran), Oxycodone-Acetaminophen (Percocet).  Stop taking: Aspirin, Ibuprofen, NSAIDS, Aleve, Naproxen, BC's, Goody's, Fish Oil, and herbal medications.    Do not wear jewelry..  Do not wear lotions, powders, or cologne.  You may wear deodorant.  Men may shave face and neck.  Do not bring valuables to the hospital.  [facility] is not responsible for any belongings or valuables.  Contacts, dentures or bridgework may not be worn into surgery.  Leave your suitcase in the car.  After surgery it may be brought to your room.  For patients admitted to the hospital, discharge time will be determined by your treatment team.  Patients discharged the day of surgery will not be allowed to drive home.   Special instructions:  See attached.   Please read over the following fact sheets that you were given. Pain Booklet, Coughing and Deep Breathing, MRSA Information and Surgical Site Infection Prevention

## 2015-04-18 NOTE — Progress Notes (Signed)
Pt. Informed to bring brace day of surgery and verbalized understanding.   PCP - Sanjuana Kava at [facility] - denies  EKG/CXR - denies  Cardiac Cath - denies  Echo - denies

## 2015-04-28 ENCOUNTER — Ambulatory Visit
Admission: RE | Admit: 2015-04-28 | Discharge: 2015-04-28 | Disposition: A | Discharge status: Home / Self Care | Payer: Managed Care, Other (non HMO) | Source: Ambulatory Visit | Attending: Orthopedic Surgery | Admitting: Orthopedic Surgery

## 2015-04-28 DIAGNOSIS — Z4789 Encounter for other orthopedic aftercare: Secondary | ICD-10-CM

## 2015-05-01 ENCOUNTER — Ambulatory Visit (HOSPITAL_COMMUNITY): Payer: Managed Care, Other (non HMO)

## 2015-05-01 ENCOUNTER — Ambulatory Visit (HOSPITAL_COMMUNITY): Payer: Managed Care, Other (non HMO) | Admitting: Certified Registered Nurse Anesthetist

## 2015-05-01 ENCOUNTER — Encounter (HOSPITAL_COMMUNITY)
Admission: AD | Disposition: A | Discharge status: Home / Self Care | Payer: Self-pay | Source: Ambulatory Visit | Attending: Orthopedic Surgery

## 2015-05-01 ENCOUNTER — Inpatient Hospital Stay (HOSPITAL_COMMUNITY)
Admission: AD | Admit: 2015-05-01 | Discharge: 2015-05-02 | DRG: 496 | Disposition: A | Discharge status: Home / Self Care | Payer: Managed Care, Other (non HMO) | Source: Ambulatory Visit | Attending: Orthopedic Surgery | Admitting: Orthopedic Surgery

## 2015-05-01 ENCOUNTER — Encounter (HOSPITAL_COMMUNITY): Payer: Self-pay | Admitting: *Deleted

## 2015-05-01 DIAGNOSIS — F410 Panic disorder [episodic paroxysmal anxiety] without agoraphobia: Secondary | ICD-10-CM | POA: Diagnosis present

## 2015-05-01 DIAGNOSIS — M4856XG Collapsed vertebra, not elsewhere classified, lumbar region, subsequent encounter for fracture with delayed healing: Principal | ICD-10-CM | POA: Diagnosis present

## 2015-05-01 DIAGNOSIS — F17210 Nicotine dependence, cigarettes, uncomplicated: Secondary | ICD-10-CM | POA: Diagnosis present

## 2015-05-01 DIAGNOSIS — E780 Pure hypercholesterolemia: Secondary | ICD-10-CM | POA: Diagnosis present

## 2015-05-01 DIAGNOSIS — M961 Postlaminectomy syndrome, not elsewhere classified: Secondary | ICD-10-CM | POA: Diagnosis present

## 2015-05-01 DIAGNOSIS — Z981 Arthrodesis status: Secondary | ICD-10-CM

## 2015-05-01 DIAGNOSIS — Y838 Other surgical procedures as the cause of abnormal reaction of the patient, or of later complication, without mention of misadventure at the time of the procedure: Secondary | ICD-10-CM | POA: Diagnosis present

## 2015-05-01 DIAGNOSIS — IMO0002 Reserved for concepts with insufficient information to code with codable children: Secondary | ICD-10-CM

## 2015-05-01 DIAGNOSIS — K219 Gastro-esophageal reflux disease without esophagitis: Secondary | ICD-10-CM | POA: Diagnosis present

## 2015-05-01 DIAGNOSIS — Z9889 Other specified postprocedural states: Secondary | ICD-10-CM

## 2015-05-01 DIAGNOSIS — T8484XA Pain due to internal orthopedic prosthetic devices, implants and grafts, initial encounter: Secondary | ICD-10-CM | POA: Diagnosis present

## 2015-05-01 DIAGNOSIS — G43909 Migraine, unspecified, not intractable, without status migrainosus: Secondary | ICD-10-CM | POA: Diagnosis present

## 2015-05-01 DIAGNOSIS — M545 Low back pain: Secondary | ICD-10-CM | POA: Diagnosis present

## 2015-05-01 DIAGNOSIS — G8929 Other chronic pain: Secondary | ICD-10-CM | POA: Diagnosis present

## 2015-05-01 DIAGNOSIS — Z885 Allergy status to narcotic agent status: Secondary | ICD-10-CM | POA: Diagnosis not present

## 2015-05-01 DIAGNOSIS — F419 Anxiety disorder, unspecified: Secondary | ICD-10-CM | POA: Diagnosis present

## 2015-05-01 SURGERY — POSTERIOR LUMBAR FUSION 3 WITH HARDWARE REMOVAL
Anesthesia: General

## 2015-05-01 MED ORDER — CEFAZOLIN SODIUM 1-5 GM-% IV SOLN
INTRAVENOUS | Status: AC
  Filled 2015-05-01: qty 50

## 2015-05-01 MED ORDER — CEFAZOLIN SODIUM-DEXTROSE 2-3 GM-% IV SOLR
INTRAVENOUS | Status: AC
  Administered 2015-05-01 (×2): 3 g via INTRAVENOUS
  Filled 2015-05-01: qty 50

## 2015-05-01 MED ORDER — THROMBIN 20000 UNITS EX SOLR
CUTANEOUS | Status: AC
  Filled 2015-05-01: qty 20000

## 2015-05-01 MED ORDER — FENTANYL CITRATE (PF) 100 MCG/2ML IJ SOLN
25.0000 ug | INTRAMUSCULAR | Status: DC | PRN
  Administered 2015-05-01 (×4): 50 ug via INTRAVENOUS

## 2015-05-01 MED ORDER — DEXAMETHASONE SODIUM PHOSPHATE 4 MG/ML IJ SOLN
INTRAMUSCULAR | Status: DC | PRN
  Administered 2015-05-01: 8 mg via INTRAVENOUS

## 2015-05-01 MED ORDER — FENTANYL CITRATE (PF) 100 MCG/2ML IJ SOLN
INTRAMUSCULAR | Status: DC | PRN
  Administered 2015-05-01 (×2): 50 ug via INTRAVENOUS
  Administered 2015-05-01: 100 ug via INTRAVENOUS
  Administered 2015-05-01 (×2): 50 ug via INTRAVENOUS
  Administered 2015-05-01: 100 ug via INTRAVENOUS

## 2015-05-01 MED ORDER — FENTANYL CITRATE (PF) 250 MCG/5ML IJ SOLN
INTRAMUSCULAR | Status: AC
  Filled 2015-05-01: qty 5

## 2015-05-01 MED ORDER — PROPOFOL 10 MG/ML IV BOLUS
INTRAVENOUS | Status: AC
  Filled 2015-05-01: qty 20

## 2015-05-01 MED ORDER — ONDANSETRON HCL 4 MG/2ML IJ SOLN
INTRAMUSCULAR | Status: DC | PRN
  Administered 2015-05-01: 4 mg via INTRAVENOUS

## 2015-05-01 MED ORDER — NEOSTIGMINE METHYLSULFATE 10 MG/10ML IV SOLN
INTRAVENOUS | Status: DC | PRN
  Administered 2015-05-01: 5 mg via INTRAVENOUS

## 2015-05-01 MED ORDER — SODIUM CHLORIDE 0.9 % IV SOLN
250.0000 mL | INTRAVENOUS | Status: DC
Start: 2015-05-01 — End: 2015-05-02

## 2015-05-01 MED ORDER — ONDANSETRON HCL 4 MG/2ML IJ SOLN: 4.0000 mg | INTRAMUSCULAR | Status: DC | PRN

## 2015-05-01 MED ORDER — LIDOCAINE HCL (CARDIAC) 20 MG/ML IV SOLN
INTRAVENOUS | Status: DC | PRN
  Administered 2015-05-01: 50 mg via INTRAVENOUS

## 2015-05-01 MED ORDER — GLYCOPYRROLATE 0.2 MG/ML IJ SOLN
INTRAMUSCULAR | Status: DC | PRN
  Administered 2015-05-01: 0.6 mg via INTRAVENOUS

## 2015-05-01 MED ORDER — CARISOPRODOL 350 MG PO TABS
350.0000 mg | ORAL_TABLET | Freq: Three times a day (TID) | ORAL | Status: DC
  Administered 2015-05-01 – 2015-05-02 (×3): 350 mg via ORAL
  Filled 2015-05-01 (×4): qty 1

## 2015-05-01 MED ORDER — SODIUM CHLORIDE 0.9 % IJ SOLN
3.0000 mL | Freq: Two times a day (BID) | INTRAMUSCULAR | Status: DC
Start: 2015-05-01 — End: 2015-05-02
  Administered 2015-05-01 – 2015-05-02 (×2): 3 mL via INTRAVENOUS

## 2015-05-01 MED ORDER — LACTATED RINGERS IV SOLN
INTRAVENOUS | Status: DC | PRN
  Administered 2015-05-01 (×2): via INTRAVENOUS

## 2015-05-01 MED ORDER — CEFAZOLIN SODIUM 1-5 GM-% IV SOLN
1.0000 g | Freq: Three times a day (TID) | INTRAVENOUS | Status: AC
  Administered 2015-05-01: 1 g via INTRAVENOUS
  Filled 2015-05-01 (×2): qty 50

## 2015-05-01 MED ORDER — THROMBIN 20000 UNITS EX SOLR
OROMUCOSAL | Status: DC | PRN
  Administered 2015-05-01: 08:00:00 via TOPICAL

## 2015-05-01 MED ORDER — ROCURONIUM BROMIDE 100 MG/10ML IV SOLN
INTRAVENOUS | Status: DC | PRN
  Administered 2015-05-01: 20 mg via INTRAVENOUS
  Administered 2015-05-01: 50 mg via INTRAVENOUS

## 2015-05-01 MED ORDER — KETOROLAC TROMETHAMINE 30 MG/ML IJ SOLN
INTRAMUSCULAR | Status: AC
  Filled 2015-05-01: qty 1

## 2015-05-01 MED ORDER — BUPIVACAINE-EPINEPHRINE 0.25% -1:200000 IJ SOLN
INTRAMUSCULAR | Status: DC | PRN
  Administered 2015-05-01: 20 mL

## 2015-05-01 MED ORDER — DIAZEPAM 5 MG PO TABS
ORAL_TABLET | ORAL | Status: AC
  Filled 2015-05-01: qty 2

## 2015-05-01 MED ORDER — HYDROMORPHONE HCL 1 MG/ML IJ SOLN
INTRAMUSCULAR | Status: AC
  Administered 2015-05-01: 0.5 mg via INTRAVENOUS
  Filled 2015-05-01: qty 2

## 2015-05-01 MED ORDER — OXYCODONE HCL 5 MG PO TABS
15.0000 mg | ORAL_TABLET | ORAL | Status: DC | PRN
  Administered 2015-05-01 – 2015-05-02 (×4): 15 mg via ORAL
  Filled 2015-05-01 (×4): qty 3

## 2015-05-01 MED ORDER — MIDAZOLAM HCL 5 MG/5ML IJ SOLN
INTRAMUSCULAR | Status: DC | PRN
  Administered 2015-05-01: 2 mg via INTRAVENOUS

## 2015-05-01 MED ORDER — SODIUM CHLORIDE 0.9 % IJ SOLN: 3.0000 mL | INTRAMUSCULAR | Status: DC | PRN

## 2015-05-01 MED ORDER — FENTANYL CITRATE (PF) 100 MCG/2ML IJ SOLN
INTRAMUSCULAR | Status: AC
  Filled 2015-05-01: qty 4

## 2015-05-01 MED ORDER — OXYCODONE HCL 5 MG PO TABS
ORAL_TABLET | ORAL | Status: AC
  Filled 2015-05-01: qty 2

## 2015-05-01 MED ORDER — DIAZEPAM 5 MG PO TABS
10.0000 mg | ORAL_TABLET | Freq: Four times a day (QID) | ORAL | Status: DC | PRN
  Administered 2015-05-01: 10 mg via ORAL

## 2015-05-01 MED ORDER — HYDROMORPHONE HCL 1 MG/ML IJ SOLN
INTRAMUSCULAR | Status: AC
  Filled 2015-05-01: qty 1

## 2015-05-01 MED ORDER — DEXAMETHASONE 4 MG PO TABS
4.0000 mg | ORAL_TABLET | Freq: Four times a day (QID) | ORAL | Status: DC
  Administered 2015-05-01 – 2015-05-02 (×2): 4 mg via ORAL
  Filled 2015-05-01 (×2): qty 1

## 2015-05-01 MED ORDER — HYDROMORPHONE HCL 1 MG/ML IJ SOLN
0.5000 mg | INTRAMUSCULAR | Status: AC | PRN
  Administered 2015-05-01 (×4): 0.5 mg via INTRAVENOUS

## 2015-05-01 MED ORDER — LACTATED RINGERS IV SOLN: INTRAVENOUS | Status: DC

## 2015-05-01 MED ORDER — KETOROLAC TROMETHAMINE 30 MG/ML IJ SOLN
INTRAMUSCULAR | Status: AC
  Administered 2015-05-01: 30 mg
  Filled 2015-05-01: qty 1

## 2015-05-01 MED ORDER — QUETIAPINE FUMARATE 200 MG PO TABS
400.0000 mg | ORAL_TABLET | Freq: Every day | ORAL | Status: DC
  Administered 2015-05-01: 400 mg via ORAL
  Filled 2015-05-01: qty 2

## 2015-05-01 MED ORDER — OXYCODONE HCL 5 MG PO TABS
10.0000 mg | ORAL_TABLET | ORAL | Status: DC | PRN
  Administered 2015-05-01: 10 mg via ORAL

## 2015-05-01 MED ORDER — MIDAZOLAM HCL 2 MG/2ML IJ SOLN
INTRAMUSCULAR | Status: AC
  Filled 2015-05-01: qty 2

## 2015-05-01 MED ORDER — KETOROLAC TROMETHAMINE 0.5 % OP SOLN
2.0000 [drp] | Freq: Four times a day (QID) | OPHTHALMIC | Status: DC
  Administered 2015-05-01 – 2015-05-02 (×4): 2 [drp] via OPHTHALMIC
  Filled 2015-05-01: qty 3

## 2015-05-01 MED ORDER — ONDANSETRON HCL 4 MG/2ML IJ SOLN: 4.0000 mg | Freq: Once | INTRAMUSCULAR | Status: DC | PRN

## 2015-05-01 MED ORDER — PROPOFOL 10 MG/ML IV BOLUS
INTRAVENOUS | Status: DC | PRN
  Administered 2015-05-01: 300 mg via INTRAVENOUS

## 2015-05-01 MED ORDER — BUPIVACAINE-EPINEPHRINE (PF) 0.25% -1:200000 IJ SOLN
INTRAMUSCULAR | Status: AC
  Filled 2015-05-01: qty 30

## 2015-05-01 MED ORDER — MENTHOL 3 MG MT LOZG: 1.0000 | LOZENGE | OROMUCOSAL | Status: DC | PRN

## 2015-05-01 MED ORDER — OXYCODONE HCL ER 15 MG PO T12A
15.0000 mg | EXTENDED_RELEASE_TABLET | Freq: Two times a day (BID) | ORAL | Status: DC
  Administered 2015-05-01 – 2015-05-02 (×3): 15 mg via ORAL
  Filled 2015-05-01 (×3): qty 1

## 2015-05-01 MED ORDER — DEXAMETHASONE SODIUM PHOSPHATE 4 MG/ML IJ SOLN
4.0000 mg | Freq: Four times a day (QID) | INTRAMUSCULAR | Status: DC
Start: 2015-05-01 — End: 2015-05-02
  Administered 2015-05-01: 4 mg via INTRAVENOUS
  Filled 2015-05-01: qty 1

## 2015-05-01 MED ORDER — PHENOL 1.4 % MT LIQD
1.0000 | OROMUCOSAL | Status: DC | PRN
Start: 2015-05-01 — End: 2015-05-02
  Administered 2015-05-01: 1 via OROMUCOSAL
  Filled 2015-05-01: qty 177

## 2015-05-01 SURGICAL SUPPLY — 58 items
BLADE SURG ROTATE 9660 (MISCELLANEOUS) ×3 IMPLANT
BUR EGG ELITE 4.0 (BURR) IMPLANT
BUR EGG ELITE 4.0MM (BURR)
CLOSURE STERI-STRIP 1/2X4 (GAUZE/BANDAGES/DRESSINGS) ×2
CLSR STERI-STRIP ANTIMIC 1/2X4 (GAUZE/BANDAGES/DRESSINGS) ×4 IMPLANT
COVER MAYO STAND STRL (DRAPES) IMPLANT
COVER SURGICAL LIGHT HANDLE (MISCELLANEOUS) ×3 IMPLANT
DRAPE C-ARM 42X72 X-RAY (DRAPES) ×3 IMPLANT
DRAPE C-ARMOR (DRAPES) ×3 IMPLANT
DRAPE POUCH INSTRU U-SHP 10X18 (DRAPES) ×3 IMPLANT
DRAPE SURG 17X23 STRL (DRAPES) ×3 IMPLANT
DRAPE U-SHAPE 47X51 STRL (DRAPES) ×3 IMPLANT
DRSG MEPILEX BORDER 4X8 (GAUZE/BANDAGES/DRESSINGS) ×6 IMPLANT
DURAPREP 26ML APPLICATOR (WOUND CARE) ×3 IMPLANT
ELECT BLADE 4.0 EZ CLEAN MEGAD (MISCELLANEOUS)
ELECT BLADE 6.5 EXT (BLADE) ×3 IMPLANT
ELECT CAUTERY BLADE 6.4 (BLADE) ×3 IMPLANT
ELECT PENCIL ROCKER SW 15FT (MISCELLANEOUS) ×3 IMPLANT
ELECT REM PT RETURN 9FT ADLT (ELECTROSURGICAL) ×3
ELECTRODE BLDE 4.0 EZ CLN MEGD (MISCELLANEOUS) IMPLANT
ELECTRODE REM PT RTRN 9FT ADLT (ELECTROSURGICAL) ×1 IMPLANT
GLOVE BIOGEL PI IND STRL 8.5 (GLOVE) ×1 IMPLANT
GLOVE BIOGEL PI INDICATOR 8.5 (GLOVE) ×2
GLOVE SS BIOGEL STRL SZ 8.5 (GLOVE) ×1 IMPLANT
GLOVE SUPERSENSE BIOGEL SZ 8.5 (GLOVE) ×2
GOWN STRL REUS W/ TWL LRG LVL3 (GOWN DISPOSABLE) ×2 IMPLANT
GOWN STRL REUS W/TWL 2XL LVL3 (GOWN DISPOSABLE) ×6 IMPLANT
GOWN STRL REUS W/TWL LRG LVL3 (GOWN DISPOSABLE) ×4
KIT BASIN OR (CUSTOM PROCEDURE TRAY) ×3 IMPLANT
KIT POSITION SURG JACKSON T1 (MISCELLANEOUS) ×3 IMPLANT
KIT ROOM TURNOVER OR (KITS) ×3 IMPLANT
NEEDLE 22X1 1/2 (OR ONLY) (NEEDLE) ×3 IMPLANT
NEEDLE SPNL 18GX3.5 QUINCKE PK (NEEDLE) ×3 IMPLANT
NS IRRIG 1000ML POUR BTL (IV SOLUTION) ×3 IMPLANT
PACK LAMINECTOMY ORTHO (CUSTOM PROCEDURE TRAY) ×3 IMPLANT
PACK UNIVERSAL I (CUSTOM PROCEDURE TRAY) ×3 IMPLANT
PAD ARMBOARD 7.5X6 YLW CONV (MISCELLANEOUS) ×6 IMPLANT
PATTIES SURGICAL .5 X.5 (GAUZE/BANDAGES/DRESSINGS) IMPLANT
PATTIES SURGICAL .5 X1 (DISPOSABLE) ×3 IMPLANT
POSITIONER HEAD PRONE TRACH (MISCELLANEOUS) ×3 IMPLANT
SPONGE LAP 4X18 X RAY DECT (DISPOSABLE) IMPLANT
SPONGE SURGIFOAM ABS GEL 100 (HEMOSTASIS) ×3 IMPLANT
SURGIFLO TRUKIT (HEMOSTASIS) IMPLANT
SUT BONE WAX W31G (SUTURE) ×6 IMPLANT
SUT MNCRL AB 3-0 PS2 18 (SUTURE) ×6 IMPLANT
SUT VIC AB 0 CTB1 27 (SUTURE) ×6 IMPLANT
SUT VIC AB 1 CT1 18XCR BRD 8 (SUTURE) IMPLANT
SUT VIC AB 1 CT1 8-18 (SUTURE)
SUT VIC AB 1 CTX 36 (SUTURE)
SUT VIC AB 1 CTX36XBRD ANBCTR (SUTURE) IMPLANT
SUT VIC AB 2-0 CT1 18 (SUTURE) ×9 IMPLANT
SYR BULB IRRIGATION 50ML (SYRINGE) ×3 IMPLANT
SYR CONTROL 10ML LL (SYRINGE) ×3 IMPLANT
TOWEL OR 17X24 6PK STRL BLUE (TOWEL DISPOSABLE) ×3 IMPLANT
TOWEL OR 17X26 10 PK STRL BLUE (TOWEL DISPOSABLE) ×3 IMPLANT
TRAY FOLEY CATH 16FRSI W/METER (SET/KITS/TRAYS/PACK) IMPLANT
WATER STERILE IRR 1000ML POUR (IV SOLUTION) ×3 IMPLANT
YANKAUER SUCT BULB TIP NO VENT (SUCTIONS) ×3 IMPLANT

## 2015-05-01 NOTE — Progress Notes (Addendum)
This RN at beside offering to assist patient to BR. Pt continues to c/o "I can't pee and I need a catheter". RN offered to perform IN&OUT after I bladder scan him. Pt then verbalized that bladder scanning him will cause him more pain than the catheter. Patient behavior escalates with inappropriate language and then places himself on the floor. Patient demand that any doctor need that are able need to write him a RX for "self catheter" and to discharge him. RN explained that MD is in surgery at this time. ChiropodistAssistant director and security at bedside. MD paged, left message to OR RN.   Sim BoastHavy, RN

## 2015-05-01 NOTE — Progress Notes (Signed)
Dr. Gary FleetBrooks's PA Porfirio Mylar(Carmen) at bedside with orders. Patient appears calm at this time. Will continue to monitor.   Sim BoastHavy, RN

## 2015-05-01 NOTE — OR Nursing (Signed)
Pt instructed by RN not to rub eyes. Pt repeatedly rubbing eyes very vigorously. Now c/o " not being able to see and it feels itchy and like something is in them." Dr. Noreene LarssonJoslin notified, Acular eye drops ordered. Pt is very demanding, beligerent and is not receptive to education from PACU RN.

## 2015-05-01 NOTE — Brief Op Note (Signed)
05/01/2015  10:16 AM  PATIENT:  Deanna ArtisWilliam H Nodarse  24 y.o. male  PRE-OPERATIVE DIAGNOSIS:  L2-3 COMPRESSION FRACTURE  POST-OPERATIVE DIAGNOSIS:  L2-3 compression fracture  PROCEDURE:  Procedure(s): REMOVAL OF HARDWARE L1-L4  (N/A)  SURGEON:  Surgeon(s) and Role:    * Venita Lickahari Lynnex Fulp, MD - Primary  PHYSICIAN ASSISTANT:   ASSISTANTS: carmen mayo   ANESTHESIA:   general  EBL:  Total I/O In: 1000 [I.V.:1000] Out: 50 [Blood:50]  BLOOD ADMINISTERED:none  DRAINS: none   LOCAL MEDICATIONS USED:  MARCAINE     SPECIMEN:  No Specimen  DISPOSITION OF SPECIMEN:  N/A  COUNTS:  YES  TOURNIQUET:  * No tourniquets in log *  DICTATION: .Other Dictation: Dictation Number [id number]  PLAN OF CARE: Admit to inpatient   PATIENT DISPOSITION:  PACU - hemodynamically stable.

## 2015-05-01 NOTE — Progress Notes (Signed)
Charge RN and attending RN offered assistance to patient to use BR but he refused. Patient refuses for staffs to assist him to bathroom. Patient places himself on the floor and prefers to crawl to bathroom instead of walking. Patient pulled PIV out stating "they're not going to give me more pain medication so I don't need it". RN explained the importance of having PIV during his stay. Attending paged MD r/t his current situation. Awaiting to call back. Bed alarm activated. Emotional support given but patient refused. Will continue to monitor.    Sim BoastHavy, RN

## 2015-05-01 NOTE — Anesthesia Preprocedure Evaluation (Signed)
Anesthesia Evaluation  Patient identified by MRN, date of birth, ID band Patient awake    Reviewed: Allergy & Precautions, NPO status , Patient's Chart, lab work & pertinent test results  Airway Mallampati: II  TM Distance: >3 FB Neck ROM: Full    Dental  (+) Teeth Intact, Dental Advisory Given   Pulmonary Current Smoker,  breath sounds clear to auscultation        Cardiovascular Rhythm:Regular Rate:Normal     Neuro/Psych    GI/Hepatic   Endo/Other    Renal/GU      Musculoskeletal   Abdominal (+) - obese,   Peds  Hematology   Anesthesia Other Findings   Reproductive/Obstetrics                             Anesthesia Physical Anesthesia Plan  ASA: II  Anesthesia Plan: General   Post-op Pain Management:    Induction: Intravenous  Airway Management Planned: Oral ETT  Additional Equipment:   Intra-op Plan:   Post-operative Plan: Extubation in OR  Informed Consent: I have reviewed the patients History and Physical, chart, labs and discussed the procedure including the risks, benefits and alternatives for the proposed anesthesia with the patient or authorized representative who has indicated his/her understanding and acceptance.   Dental advisory given  Plan Discussed with: CRNA and Anesthesiologist  Anesthesia Plan Comments:         Anesthesia Quick Evaluation

## 2015-05-01 NOTE — H&P (Signed)
istory of Present Illness  The patient is a 24 year old male who comes in today for a preoperative History and Physical. The patient is scheduled for a L1-L4 hardware removal to be performed by Dr. Debria Garretahari D. Shon BatonBrooks, [facility] 05-01-15 . Please see the hospital record for complete dictated history and physical. The pt reports his LBP is constant. He says when he stands his pain will radiate into his b/l legs. He says he is not going to stop smoking. He reports smoking a half pack a day.  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 We had oxycodone as an allergy, but he states he IS able to take that./ljf  Family History Diabetes Mellitus Maternal Grandfather. Hypertension Maternal Grandmother. First Degree Relatives Chronic Obstructive Lung Disease Maternal Grandmother.  Social History  Exercise Exercises weekly; does running / walking and gym / weights Living situation live with parents Tobacco / smoke exposure 09/18/2013: no No history of drug/alcohol rehab Current drinker 09/18/2013: Currently drinks beer only  occasionally per week Not under pain contract Marital status single Children 0 Tobacco use Former smoker, Current some day smoker. Current work status working full time Number of flights of stairs before winded 4-5  Medication History Medications Reconciled Norco (5-325MG  Tablet, 1 (one) Tablet Oral PO TID, Taken starting 04/12/2015) Active. (DDB/RCY) Soma (250MG  Tablet, 1 (one) Tablet Oral PO TID, Taken starting 04/12/2015) Active. (DDB/RCY; TO LAST UNTIL SURGERY 05-01-15) Soma (350MG  Tablet, 1 (one) Tablet Oral PO TID, Taken starting 04/12/2015) Active. (DDB/RCY; TO LAST UNTIL 05-01-15) SEROquel (400MG  Tablet, Oral) Active. (400mg  qhs)  Past Surgical History  No pertinent past surgical history  Other Problems  Unspecified Diagnosis Hypercholesterolemia Anxiety Disorder  Vitals  04/28/2015 8:18 AM Weight: 270 lb Height: 73in Body Surface Area: 2.44 m Body Mass Index: 35.62 kg/m  Temp.: 98.15F  BP: 151/99 (Sitting, Left Arm, Standard)  Physical Exam  General General Appearance-Not in acute distress. Orientation-Oriented X3. Build & Nutrition-Well nourished and Well developed.  Integumentary General Characteristics Surgical Scars - no surgical scar evidence of previous lumbar surgery. Lumbar Spine-Skin examination of the lumbar spine is without deformity, skin lesions, lacerations or abrasions.  Chest and Lung Exam Auscultation Breath sounds - Normal and Clear.  Cardiovascular Auscultation Rhythm - Regular rate and rhythm.  Abdomen Palpation/Percussion Palpation and Percussion of the abdomen reveal - Soft, Non Tender and No Rebound tenderness.  Peripheral Vascular Lower Extremity Palpation - Posterior tibial pulse - Bilateral - 2+. Dorsalis pedis pulse - Bilateral - 2+.  Neurologic Sensation Lower Extremity - Bilateral - sensation is intact in the lower extremity. Reflexes Patellar Reflex - Bilateral - 1+. Achilles Reflex -  Bilateral - 1+. Clonus - Bilateral - clonus not present. Hoffman's Sign - Bilateral - Hoffman's sign not present. Testing Seated Straight Leg Raise - Bilateral - Seated straight leg raise negative.  Musculoskeletal Spine/Ribs/Pelvis  Lumbosacral Spine:  Inspection and Palpation - Tenderness - left lumbar paraspinals tender to palpation and right lumbar paraspinals tender to palpation. Strength and Tone: Strength - Hip Flexion - Bilateral - 5/5. Knee Extension - Bilateral - 5/5. Knee Flexion - Bilateral - 5/5. Ankle Dorsiflexion - Bilateral - 5/5. Ankle Plantarflexion - Bilateral - 5/5. Heel walk - Bilateral - unable to heel walk. Toe Walk - Bilateral - unable to walk on toes. Heel-Toe Walk - Bilateral - able to heel-toe walk with moderate difficulty. ROM - Flexion - severely decreased range of motion and painful. Extension - severely decreased range of motion and painful. Left Lateral Bending - severely decreased range of motion and painful. Right Lateral Bending - severely decreased range of motion and painful. Pain - extension is more painful than flexion. Lumbosacral Spine - Waddell's Signs - no Waddell's signs present. Lower Extremity Range of Motion - No true hip, knee or ankle pain with range of motion. Gait and Station - [employer] - no assistive devices. Note: Pt doesn't wish to perform certain activities because he is concerned about increasing pain  CT scan: Hardware secure.  No change in alignment of fracture.  Coronal fx still present no interval healing noted.   Plan:  Removal of hardware as planned.  Patient had kyphoplasty and spinal instrumentation without fusion to maintain stability.  Pain is still present but it has improved.  Discussed options - concern about screw loosening or breakage given the fact that no fusion was done.  Patient in agreement.  Will remove hardware and return to [facility] for 6-8 weeks.   I have gone over the surgery with the patient and his mom, both of whom  are present for the dictation. This would probably be an overnight stay, removal of his L1 and L4 screw and rod construct. After the wounds are healed in about four to six weeks, we will start him on an aggressive physical therapy regimen to build back up the strength of the muscles, improve his posture, and hopefully improve his quality of life. He has been off of all of his narcotic medications and I would like to keep him off until after surgery, but he is still having some muscle pains, so I am going to give him Soma, which he has had in the past that helped him. We will plan for the surgery in the very near future. All of his questions were addressed. Risks include infection, bleeding, nerve damage, death, stroke, paralysis, ongoing or worse pain, need for further surgery.

## 2015-05-01 NOTE — OR Nursing (Signed)
Arrived to room 3C 08 on bed from PACU. Pt refused to get out of bed and ambulate into room. Pt stated he was "not going to tax his body like that after just having surgery." He demanded that a "man be present so he would not fall in the floor." Security notified and arrived to assist patient. Pt refused again to get out of bed. He stated he would not get up and he wanted to go to a room where the bed fit through the door. Pt room reassigned to 4N 30.

## 2015-05-01 NOTE — Anesthesia Procedure Notes (Signed)
Procedure Name: Intubation Date/Time: 05/01/2015 7:45 AM Performed by: Marylyn IshiharaFURLOW, Kalik Hoare Pre-anesthesia Checklist: Patient identified, Timeout performed, Emergency Drugs available, Suction available and Patient being monitored Patient Re-evaluated:Patient Re-evaluated prior to inductionOxygen Delivery Method: Circle system utilized Preoxygenation: Pre-oxygenation with 100% oxygen Intubation Type: IV induction Ventilation: Mask ventilation without difficulty Laryngoscope Size: Mac and 4 Grade View: Grade I Tube type: Oral Tube size: 7.5 mm Number of attempts: 1 Placement Confirmation: ETT inserted through vocal cords under direct vision,  breath sounds checked- equal and bilateral and positive ETCO2 Secured at: 22 cm Tube secured with: Tape Dental Injury: Teeth and Oropharynx as per pre-operative assessment

## 2015-05-01 NOTE — Progress Notes (Signed)
Utilization review completed. Siena Poehler, RN, BSN. 

## 2015-05-01 NOTE — Clinical Social Work Note (Signed)
CSW Consult Acknowledged:   CSW received a consult for SNF placement. Pt reported that he will go home. At this time there are no additional needs. CSW will sign off.    Halley Kincer, MSW, LCSWA [phone]

## 2015-05-01 NOTE — Progress Notes (Signed)
Pt. Arrived to floor at roughly 12:40, pt. Was arguing with PACU nurse, CNA, and security when he got here, refused any assistance, states that he is in a lot of pain but PACU had already given him all the pain medicine he could get, pt. Is crying and refusing help, tried to get him up to urinate but he refused, kept requesting a foley, I told him I needed an order, he then asked that I called the doctor because he wanted to leave AMA, I paged the doctor and am awaiting a return call, he asked me repeatedly to leave the room when I stepped out I heard the bed alarm go off, when I entered the room the patient was attempting to lower himself to the floor so he could go urinate, I stood by him as he lowered himself to the floor and crawled to the bathroom, I asked if I could assist him and he refused and asked me to leave the room, so I went to tell the charge nurse and when me and the charge came back to the room the patient had put himself back in bed and had removed his IV, we provided emotional support and left the room at his prompting, I have paged Dr. Shon BatonBrooks and am awaiting a return call

## 2015-05-01 NOTE — Anesthesia Postprocedure Evaluation (Signed)
  Anesthesia Post-op Note  Patient: Daniel [name]  Procedure(s) Performed: Procedure(s): REMOVAL OF HARDWARE L1-L4  (N/A)  Patient Location: PACU  Anesthesia Type:General  Level of Consciousness: awake, alert  and oriented  Airway and Oxygen Therapy: Patient Spontanous Breathing and Patient connected to nasal cannula oxygen  Post-op Pain: mild  Post-op Assessment: Post-op Vital signs reviewed, Patient's Cardiovascular Status Stable, Respiratory Function Stable, Patent Airway and Pain level controlled LLE Motor Response: Purposeful movement, Responds to commands   RLE Motor Response: Purposeful movement, Responds to commands RLE Sensation: No numbness      Post-op Vital Signs: stable  Last Vitals:  Filed Vitals:   05/01/15 1232  BP: 135/92  Pulse: 89  Temp: 36.9 C  Resp: 18    Complications: No apparent anesthesia complications

## 2015-05-01 NOTE — Transfer of Care (Signed)
Immediate Anesthesia Transfer of Care Note  Patient: Daniel [name]  Procedure(s) Performed: Procedure(s): REMOVAL OF HARDWARE L1-L4  (N/A)  Patient Location: PACU  Anesthesia Type:General  Level of Consciousness: awake, alert , oriented, patient cooperative and responds to stimulation  Airway & Oxygen Therapy: Patient Spontanous Breathing and Patient connected to nasal cannula oxygen  Post-op Assessment: Report given to RN, Post -op Vital signs reviewed and stable, Patient moving all extremities X 4 and Patient able to stick tongue midline  Post vital signs: stable  Last Vitals:  Filed Vitals:   05/01/15 0950  BP:   Pulse:   Temp: 36.8 C  Resp:     Complications: No apparent anesthesia complications

## 2015-05-02 MED ORDER — OXYCODONE HCL 15 MG PO TABS: 15.0000 mg | ORAL_TABLET | ORAL | Status: AC | PRN

## 2015-05-02 MED ORDER — CARISOPRODOL 350 MG PO TABS: 350.0000 mg | ORAL_TABLET | Freq: Three times a day (TID) | ORAL | Status: AC | PRN

## 2015-05-02 MED ORDER — OXYCODONE HCL ER 15 MG PO T12A: 15.0000 mg | EXTENDED_RELEASE_TABLET | Freq: Two times a day (BID) | ORAL | Status: AC

## 2015-05-02 NOTE — Evaluation (Signed)
Physical Therapy Evaluation and Discharge Patient Details Name: Daniel [name] MRN: [id number] DOB: 1990/12/14 Today's Date: 05/02/2015   History of Present Illness  24 y.o. male s/p REMOVAL OF HARDWARE L1-L4   Clinical Impression  Patient evaluated by Physical Therapy with no further acute PT needs identified. All education has been completed and the patient has no further questions. Obvious behavioral issues. Pt non-compliant with all safety education provided by physical therapy today. States "I don't care what happens to me anymore, I know I'm going to be a cripple." Encouragement provided. Disregards recommendations for safe mobility and modifiable risk factors to allow back to properly heal, as inaccurate statements. Placed head-phones in ears during conversation as he was clearly uninterested in what PT has to offer. See below for any follow-up Physial Therapy or equipment needs. PT is signing off. Thank you for this referral.      Follow Up Recommendations Outpatient PT    Equipment Recommendations  None recommended by PT    Recommendations for Other Services       Precautions / Restrictions Precautions Precautions: Back Precaution Booklet Issued: Yes (comment) Precaution Comments: reviewed Required Braces or Orthoses: Spinal Brace Spinal Brace: Thoracolumbosacral orthotic;Applied in sitting position Restrictions Weight Bearing Restrictions: No      Mobility  Bed Mobility Overal bed mobility: Independent                Transfers Overall transfer level: Needs assistance Equipment used: None Transfers: Sit to/from Stand Sit to Stand: Supervision         General transfer comment: Max cues for back precautions. Attempted to allow pt to donne brace on edge of bed but refuses and stands to apply brace. Upon returning to room pt states "I guess I'll just fall into the bed." instructed pt on safe sitting techniques to maintain back precautions however he tore his  back brace off and climbed into bed without regard to instructions for safety.  Ambulation/Gait Ambulation/Gait assistance: Supervision Ambulation Distance (Feet): 110 Feet Assistive device: Rolling walker (2 wheeled);None       General Gait Details: Started ambulating without RW inititally, impulsive not following PTs  requests for safety. Pt furniture holding for stability. Provided RW and agreed to use. Max VC for upright posture however pt non-compliant. No buckling noted. No loss of balance  Stairs            Wheelchair Mobility    Modified Rankin (Stroke Patients Only)       Balance Overall balance assessment: Needs assistance Sitting-balance support: Feet supported;No upper extremity supported Sitting balance-Leahy Scale: Good     Standing balance support: No upper extremity supported Standing balance-Leahy Scale: Fair                               Pertinent Vitals/Pain Pain Assessment: Faces Faces Pain Scale: Hurts little more Pain Location: back Pain Intervention(s): Monitored during session;Repositioned    Home Living Family/patient expects to be discharged to:: Private residence Living Arrangements: Parent Available Help at Discharge: Family;Available 24 hours/day Type of Home: House Home Access: Stairs to enter Entrance Stairs-Rails: Left Entrance Stairs-Number of Steps: 2 Home Layout: Two level Home Equipment: None      Prior Function Level of Independence: Needs assistance      ADL's / Homemaking Assistance Needed: States parents help with ADLs        Hand Dominance   Dominant Hand: Right  Extremity/Trunk Assessment   Upper Extremity Assessment: Defer to OT evaluation           Lower Extremity Assessment: Generalized weakness         Communication   Communication: No difficulties  Cognition Arousal/Alertness: Awake/alert Behavior During Therapy: Agitated;Restless;Impulsive Overall Cognitive Status: Within  Functional Limits for tasks assessed                      General Comments General comments (skin integrity, edema, etc.): Pt impulsive and non-compliant with all instructions provided by PT today. At one point patient states "I don't care what happens to me any more, I know I'm going to be a cripple." provided emotional support to patient and explained how he can be compliant with medical recommendations to allow back to properly heal. After this conversation he states, "smoking doesn't have anything to do with my back healing."  Completely disregards recommendations for safe mobility despite my efforts to explain the healing process following back surgery. Declines to practice stairs, and instead places his head-phones in to listen to music and ignore PT conversation.    Exercises        Assessment/Plan    PT Assessment Patent does not need any further PT services  PT Diagnosis Abnormality of gait;Acute pain;Generalized weakness   PT Problem List    PT Treatment Interventions     PT Goals (Current goals can be found in the Care Plan section) Acute Rehab PT Goals Patient Stated Goal: None stated PT Goal Formulation: All assessment and education complete, DC therapy    Frequency     Barriers to discharge        Co-evaluation               End of Session Equipment Utilized During Treatment: Back brace Activity Tolerance: Treatment limited secondary to agitation Patient left: in bed;with call bell/phone within reach Nurse Communication: Mobility status         Time: [id number] PT Time Calculation (min) (ACUTE ONLY): 13 min   Charges:   PT Evaluation $Initial PT Evaluation Tier I: 1 Procedure     PT G CodesBerton Mount:        Ibrohim Simmers S 05/02/2015, 11:05 AM Charlsie MerlesLogan Secor Deniz Hannan, PT [phone]

## 2015-05-02 NOTE — Progress Notes (Signed)
Patient is being d/c home. D/c instruction given and patient verbalized understanding. Condition stable. 

## 2015-05-02 NOTE — Progress Notes (Signed)
OT Cancellation Note  Patient Details Name: Daniel [name] MRN: [id number] DOB: 1991/02/03   Cancelled Treatment:    Reason Eval/Treat Not Completed: Other (comment) (Patient d/c before OT able to perform eval and make recommendations)  Ashleyanne Hemmingway , MS, OTR/L, CLT Pager: [phone]  05/02/2015, 11:55 AM

## 2015-05-02 NOTE — Progress Notes (Signed)
Patient wheeled out of the unit by volunteer service. Patient left with all belongings.

## 2015-05-02 NOTE — Discharge Summary (Signed)
Patient ID: Daniel [name] MRN: [id number] DOB/AGE: 1991-03-04 24 y.o.  Admit date: 05/01/2015 Discharge date: 05/02/2015  Admission Diagnoses:  Active Problems:   S/P hardware removal   Discharge Diagnoses:  Active Problems:   S/P hardware removal  status post Procedure(s): REMOVAL OF HARDWARE L1-L4   Past Medical History  Diagnosis Date  . Fractures     L2   L3   . Shortness of breath dyspnea     with severe pain  . History of pneumonia   . History of bronchitis   . Anxiety     panic attacks  . Panic attacks   . GERD (gastroesophageal reflux disease)   . Headache   . Migraines   . Chronic back pain     Surgeries: Procedure(s): REMOVAL OF HARDWARE L1-L4  on 05/01/2015   Consultants:    Discharged Condition: Improved  Hospital Course: Daniel [name] is an 24 y.o. male who was admitted 05/01/2015 for operative treatment of L2 and 3 fracture. Patient failed conservative treatments (please see the history and physical for the specifics) and had severe unremitting pain that affects sleep, daily activities and work/hobbies. After pre-op clearance, the patient was taken to the operating room on 05/01/2015 and underwent  Procedure(s): REMOVAL OF HARDWARE L1-L4 .    Patient was given perioperative antibiotics: Anti-infectives    Start     Dose/Rate Route Frequency Ordered Stop   05/01/15 1215  ceFAZolin (ANCEF) IVPB 1 g/50 mL premix     1 g 100 mL/hr over 30 Minutes Intravenous Every 8 hours 05/01/15 1209 05/02/15 0414   05/01/15 0715  ceFAZolin (ANCEF) 1-5 GM-% IVPB    Comments:  Virgina Evener, Diane   : cabinet override      05/01/15 0715 05/01/15 1929   05/01/15 0715  ceFAZolin (ANCEF) 2-3 GM-% IVPB SOLR    Comments:  Furlow, Diane   : cabinet override      05/01/15 0715 05/01/15 0758       Patient was given sequential compression devices and early ambulation to prevent DVT.   Patient benefited maximally from hospital stay and there were no complications. At the  time of discharge, the patient was urinating/moving their bowels without difficulty, tolerating a regular diet, pain is controlled with oral pain medications and they have been cleared by PT/OT.   Recent vital signs: Patient Vitals for the past 24 hrs:  BP Temp Temp src Pulse Resp SpO2  05/02/15 0627 125/75 mmHg 97.8 F (36.6 C) Oral 73 20 98 %  05/02/15 0120 120/63 mmHg 98.6 F (37 C) Oral 61 20 94 %  05/01/15 2108 119/72 mmHg 98.4 F (36.9 C) Oral 75 18 100 %  05/01/15 1800 (!) 139/91 mmHg 98.1 F (36.7 C) Oral 82 17 99 %  05/01/15 1232 (!) 135/92 mmHg 98.5 F (36.9 C) Oral 89 18 99 %  05/01/15 1130 - 98.7 F (37.1 C) - 74 12 95 %  05/01/15 1120 - - - 70 15 95 %  05/01/15 1115 - - - 86 14 (!) 88 %  05/01/15 1100 - - - 93 (!) 25 93 %  05/01/15 1050 - - - 82 14 94 %  05/01/15 1045 - - - 67 16 91 %  05/01/15 1035 - - - 76 16 98 %  05/01/15 1030 - - - 86 (!) 22 (!) 88 %  05/01/15 1024 - - - (!) 58 14 93 %  05/01/15 1022 (!) 143/68 mmHg - -  77 (!) 28 97 %  05/01/15 1020 - - - 83 (!) 27 94 %  05/01/15 1015 - - - 73 10 97 %  05/01/15 1010 (!) 145/91 mmHg - - 91 (!) 31 96 %  05/01/15 1005 - - - 99 (!) 21 94 %  05/01/15 1000 - - - 86 (!) 22 96 %  05/01/15 0959 - - - 86 14 99 %  05/01/15 0950 136/89 mmHg 98.2 F (36.8 C) - 91 (!) 22 94 %     Recent laboratory studies: No results for input(s): WBC, HGB, HCT, PLT, NA, K, CL, CO2, BUN, CREATININE, GLUCOSE, INR, CALCIUM in the last 72 hours.  Invalid input(s): PT, 2   Discharge Medications:     Medication List    ASK your doctor about these medications        alprazolam 2 MG tablet  Commonly known as:  XANAX  Take 1 tablet (2 mg total) by mouth 3 (three) times daily.     calcium carbonate 750 MG chewable tablet  Commonly known as:  TUMS EX  Chew 2 tablets by mouth 3 (three) times daily as needed for heartburn (acid reflux).     carisoprodol 350 MG tablet  Commonly known as:  SOMA  Take 350 mg by mouth 3 (three) times  daily.     diazepam 5 MG tablet  Commonly known as:  VALIUM  Take 1 tablet (5 mg total) by mouth every 6 (six) hours as needed for anxiety or muscle spasms.     docusate sodium 100 MG capsule  Commonly known as:  COLACE  Take 1 capsule (100 mg total) by mouth 2 (two) times daily.     HYDROmorphone 2 MG tablet  Commonly known as:  DILAUDID  Take 1-2 tablets (2-4 mg total) by mouth every 3 (three) hours as needed for severe pain.     methocarbamol 500 MG tablet  Commonly known as:  ROBAXIN  Take 1 tablet (500 mg total) by mouth every 6 (six) hours as needed for muscle spasms.     ondansetron 4 MG disintegrating tablet  Commonly known as:  ZOFRAN ODT  Take 1 tablet (4 mg total) by mouth every 8 (eight) hours as needed.     oxyCODONE-acetaminophen 5-325 MG per tablet  Commonly known as:  PERCOCET/ROXICET  Take 1 tablet by mouth 3 (three) times daily.     oxyCODONE-acetaminophen 10-325 MG per tablet  Commonly known as:  PERCOCET  Take 1 tablet by mouth every 6 (six) hours as needed for pain.     polyethylene glycol packet  Commonly known as:  MIRALAX / GLYCOLAX  Take 17 g by mouth daily.     QUEtiapine 400 MG tablet  Commonly known as:  SEROQUEL  Take 400 mg by mouth at bedtime.     zolpidem 12.5 MG CR tablet  Commonly known as:  AMBIEN CR  Take 1 tablet (12.5 mg total) by mouth at bedtime as needed for sleep.        Diagnostic Studies: Dg Lumbar Spine 2-3 Views  05/01/2015   CLINICAL DATA:  Removal of lumbar hardware  EXAM: DG C-ARM 61-120 MIN; LUMBAR SPINE - 2-3 VIEW  COMPARISON:  Lumbar spine MRI and lumbar spine CT scan of April 28 2015  FINDINGS: Three fluoro spot films. Reported fluoro time is 19 seconds. The patient has undergone removal of metallic pedicle screws from the body of L1 and L4. The connecting rods have been removed. There remains  loss of height of the bodies of L1, L2, and L3. There are post kyphoplasty changes at L2.  IMPRESSION: The patient has undergone  removal of pedicle screws and connecting rods from L1 and L4.   Electronically Signed   By: David  [place] M.D.   On: 05/01/2015 09:49   Ct Lumbar Spine Wo Contrast  04/28/2015   CLINICAL DATA:  Continued surveillance lumbar spine fracture with instrumentation.  EXAM: CT LUMBAR SPINE WITHOUT CONTRAST  TECHNIQUE: Multidetector CT imaging of the lumbar spine was performed without intravenous contrast administration. Multiplanar CT image reconstructions were also generated.  COMPARISON:  09/16/2014.  FINDINGS: Segmentation: Normal.  Alignment: Trace anterolisthesis L5 on L4 and S1 without pars defects. This is located facet arthropathy.  Vertebrae: Coronal L3 fracture without visible healing. Bi pedicular L2 kyphoplasty without further loss of vertebral body height. See unchanged cysts RIGHT superiorL1 fracture stroke where disease.  Hardware: The BILATERAL L1 and L4 screws have been placed. The rods are now connected between the screws. Slight loosening of the RIGHT L1 and LEFT L4 screws.  Paraspinal tissues: Unremarkable.  Disc levels:  L1-L2: Unchanged slight LEFT-sided foraminal narrowing due to disc material and spurring.  L2-L3: Slight asymmetric foraminal narrowing on the LEFT relates to disc material and spurring.  L3-L4:  Mild annular bulging.  Mild central canal stenosis.  L4-L5: Moderate-size central extrusion. Moderate spinal stenosis. BILATERAL neural impingement is suspected.  L5-S1:  Mild bulge.  No definite impingement.  IMPRESSION: Interval revision of hardware with the pedicle screws now connected by rods.  No interval healing of the L3 fracture.  RIGHT L1 and LEFT L4 screw loosening.  Central disc extrusion L4-5 with moderate spinal stenosis.   Electronically Signed   By: Elsie Stain M.D.   On: 04/28/2015 14:09   Dg C-arm 1-60 Min  05/01/2015   CLINICAL DATA:  Removal of lumbar hardware  EXAM: DG C-ARM 61-120 MIN; LUMBAR SPINE - 2-3 VIEW  COMPARISON:  Lumbar spine MRI and lumbar spine CT scan  of April 28 2015  FINDINGS: Three fluoro spot films. Reported fluoro time is 19 seconds. The patient has undergone removal of metallic pedicle screws from the body of L1 and L4. The connecting rods have been removed. There remains loss of height of the bodies of L1, L2, and L3. There are post kyphoplasty changes at L2.  IMPRESSION: The patient has undergone removal of pedicle screws and connecting rods from L1 and L4.   Electronically Signed   By: David  [place] M.D.   On: 05/01/2015 09:49          Follow-up Information    Follow up with Alvy Beal, MD In 2 weeks.   Specialty:  Orthopedic Surgery   Why:  For suture removal, For wound re-check   Contact information:   [address] [place] [address] [phone]       Discharge Plan:  discharge to home  Disposition: Hospital course uneventful.  Patient had significant pain control issues immediately post-op.  However current regimen is working well.  Patient neurologically intact ambulating.  Ok for d/c to home - will f/u with me in 2 weeks.    Signed: Venita Lick D for Dr. Venita Lick [facility] [phone] 05/02/2015, 7:50 AM

## 2015-05-02 NOTE — Op Note (Signed)
NAME:  DINK, CREPS NO.:  [id number]  MEDICAL RECORD NO.:  [id number]  LOCATION:  4N30C                        FACILITY:  [facility]  PHYSICIAN:  Jiayi Lengacher D. Shon Baton, M.D. DATE OF BIRTH:  1991-09-08  DATE OF PROCEDURE:  05/01/2015 DATE OF DISCHARGE:                              OPERATIVE REPORT   PREOPERATIVE DIAGNOSES: 1. L2-L3 compression fractures with deformity, status post L2     kyphoplasty. 2. L2-L4 on instrumented fusion for stability.  POSTOPERATIVE DIAGNOSES: 1. L2-L3 compression fractures with deformity, status post L2     kyphoplasty. 2. L2-L4 on instrumented fusion for stability.  OPERATIVE PROCEDURE:  Removal of hardware.  FIRST ASSISTANT:  Anette Riedel, PA-C  HISTORY:  This is a very pleasant 24 year old gentleman, who last year had a 2-level vertebral body injury, which ultimately lead.  Despite being in a brace, he kyphosed and had instability in horrific pain.  As a result, we elected to proceed with surgery.  At that time, the plan was to do a kyphoplasty to stabilize the L2 injury and do a bridging key internal fixation with pedicle screws to maintain stability while the burst fracture healed.  The patient ultimately has been followed by me since his initial surgery.  At this point, the CT scan shows no significant healing.  There is some minor healing of the fracture, but the coronal split is still present.  However, there has been no change in the overall alignment.  There is some slight lucency around the L2 screws.  After much discussion, I indicated to Donoven that I am concerned that without removing the hardware that would ultimately break and that could cause more problems.  After discussing options, we elected to proceed with the planned removal of the hardware.  All appropriate risks, benefits, and alternatives were discussed and consent was obtained.  OPERATIVE NOTE:  The patient was brought to the operating room, placed supine  on the operating table.  After successful induction of general anesthesia and endotracheal intubation, TEDs, and SCDs were applied. The patient was turned prone onto the spine frame.  Time-out was taken to confirm the patient, procedure, and all other pertinent important data.  Once this was completed, the back was prepped and draped in a standard fashion.  The previous percutaneous incision sites were re- incised and I stripped down to the deep fascia, incised the fascia, and then bluntly dissected the hardware.  Once I was there, I removed the top locking knot.  This was done at both pedicle screw sites.  I then removed the rod and then the screws.  I then placed some FloSeal into the wound to help maintain hemostasis.  I irrigated both of the wounds copiously with normal saline.  I then went to the other side and repeated this.  Once all 4 pedicle screws and 2 rods and 4 locking nuts were removed, I then closed each of the wounds.  Deep fascia were closed with #1 Vicryl sutures, superficial with 2-0 Vicryl sutures, and a 3-0 Monocryl for the skin.  Steri-Strips and dry dressings were applied.  The patient was transferred to the PACU without incident.  At the end of the case, all  needle and sponge counts were correct.  There was no adverse intraoperative events.     Rontae Inglett D. Shon BatonBrooks, M.D.     DDB/MEDQ  D:  05/01/2015  T:  05/02/2015  Job:  [id number]

## 2015-05-02 NOTE — Progress Notes (Signed)
    Subjective: Procedure(s) (LRB): REMOVAL OF HARDWARE L1-L4  (N/A) 1 Day Post-Op  Patient reports pain as 3 on 0-10 scale.  Reports none leg pain reports incisional back pain   Positive void Positive bowel movement Positive flatus Negative chest pain or shortness of breath  Objective: Vital signs in last 24 hours: Temp:  [97.8 F (36.6 C)-98.7 F (37.1 C)] 97.8 F (36.6 C) (07/01 0627) Pulse Rate:  [58-99] 73 (07/01 0627) Resp:  [10-31] 20 (07/01 0627) BP: (119-145)/(63-92) 125/75 mmHg (07/01 0627) SpO2:  [88 %-100 %] 98 % (07/01 0627)  Intake/Output from previous day: 06/30 0701 - 07/01 0700 In: 1500 [I.V.:1500] Out: 51 [Urine:1; Blood:50]  Labs: No results for input(s): WBC, RBC, HCT, PLT in the last 72 hours. No results for input(s): NA, K, CL, CO2, BUN, CREATININE, GLUCOSE, CALCIUM in the last 72 hours. No results for input(s): LABPT, INR in the last 72 hours.  Physical Exam: Neurologically intact Intact pulses distally Incision: dressing C/D/I Compartment soft  Assessment/Plan: Patient stable  xrays n/a Continue mobilization with physical therapy Continue care  Advance diet Up with therapy  Plan on d/c today Pain controlled with oxycontin and oxycodone F/u in 2 weeks  Venita Lickahari Cheron Pasquarelli, MD [facility] [phone]

## 2017-03-26 IMAGING — RF DG C-ARM 61-120 MIN
1 series · 3 of 3 positions shown · non-contrast
Comparison: Lumbar spine MRI and lumbar spine CT scan April 28, 2015

CLINICAL DATA: Removal of lumbar hardware

EXAM:
DG C-ARM 61-120 MIN; LUMBAR SPINE - 2-3 VIEW

[Series 1: run · 3 of 3 slices shown]
[im 1/3]
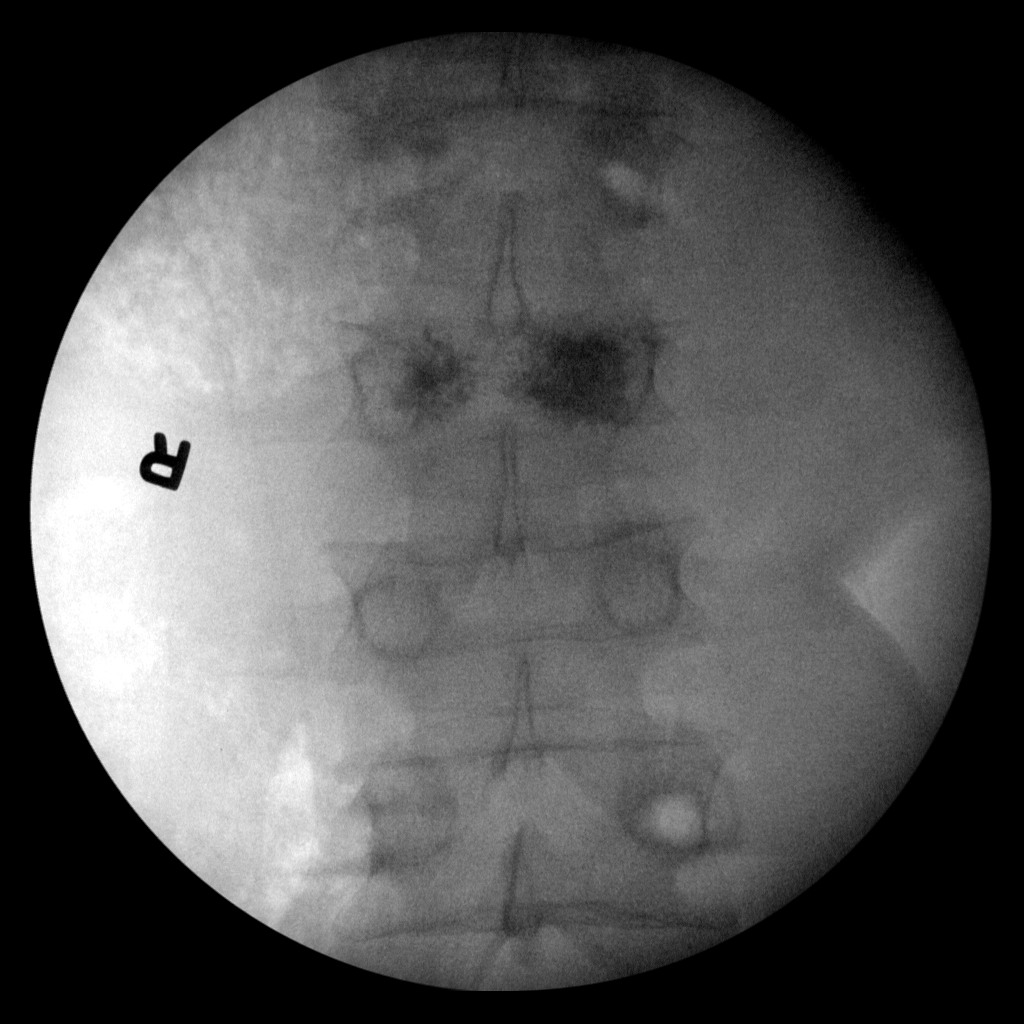
[im 2/3]
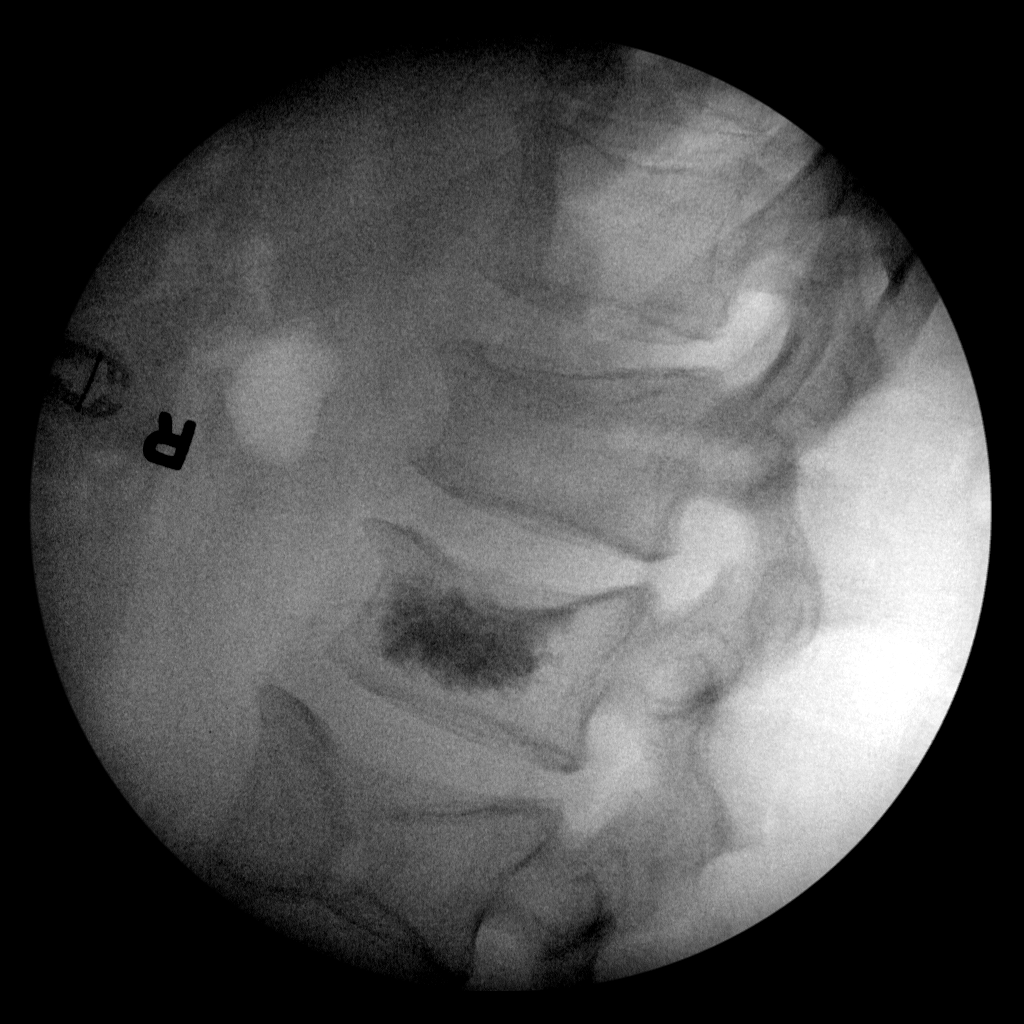
[im 3/3]
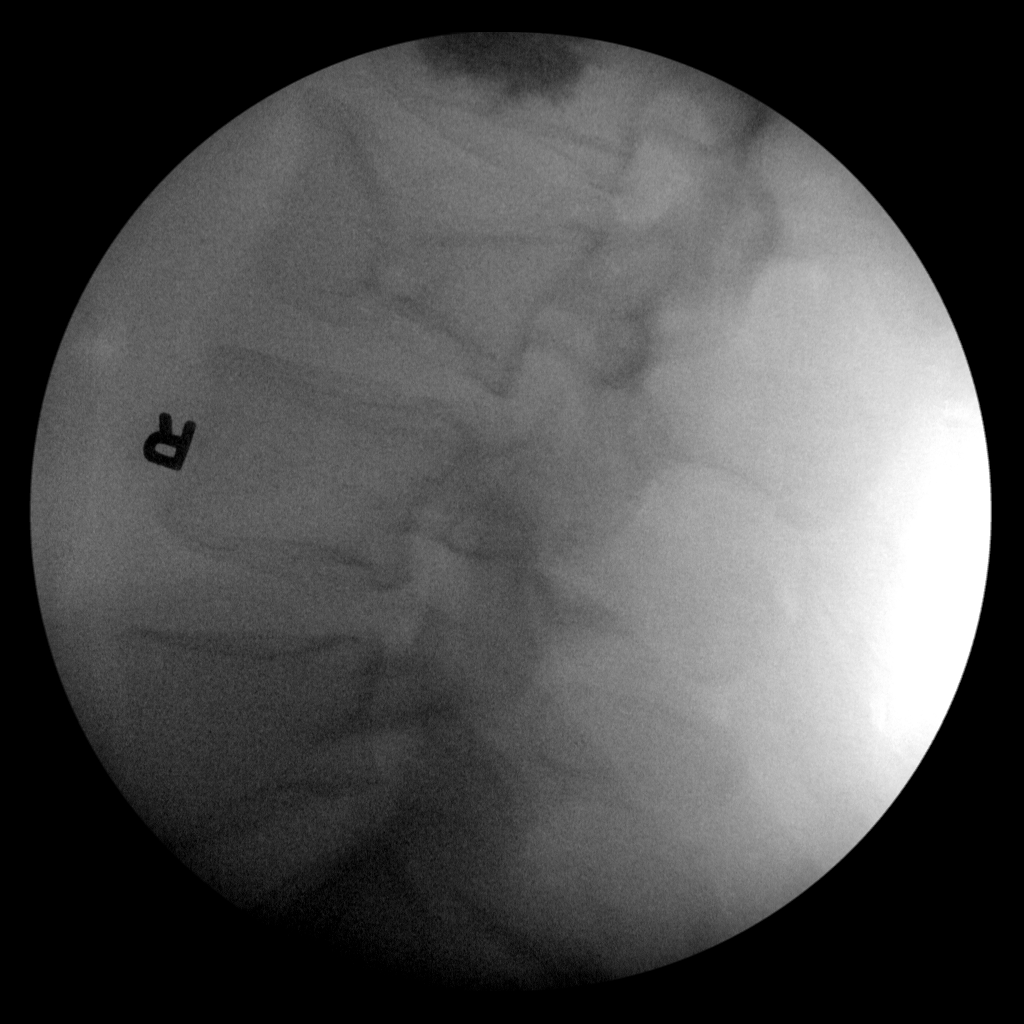

[3 of 3 positions shown; findings below may reference images not displayed]

FINDINGS: Three fluoro spot films. Reported fluoro time is 19 seconds. The
patient has undergone removal of metallic pedicle screws from the
body of L1 and L4. The connecting rods have been removed. There
remains loss of height of the bodies of L1, L2, and L3. There are
post kyphoplasty changes at L2.
IMPRESSION: The patient has undergone removal of pedicle screws and connecting
rods from L1 and L4.

## 2021-01-19 ENCOUNTER — Encounter (INDEPENDENT_AMBULATORY_CARE_PROVIDER_SITE_OTHER): Payer: Self-pay | Admitting: Ophthalmology

## 2021-01-19 ENCOUNTER — Ambulatory Visit (INDEPENDENT_AMBULATORY_CARE_PROVIDER_SITE_OTHER): Payer: Medicare Other | Admitting: Ophthalmology

## 2021-01-19 ENCOUNTER — Other Ambulatory Visit: Payer: Self-pay

## 2021-01-19 DIAGNOSIS — H3581 Retinal edema: Secondary | ICD-10-CM

## 2021-01-19 DIAGNOSIS — Q141 Congenital malformation of retina: Secondary | ICD-10-CM | POA: Diagnosis not present

## 2021-01-19 DIAGNOSIS — H33301 Unspecified retinal break, right eye: Secondary | ICD-10-CM | POA: Diagnosis not present

## 2021-01-19 MED ORDER — PREDNISOLONE ACETATE 1 % OP SUSP: 1.0000 [drp] | Freq: Four times a day (QID) | OPHTHALMIC | 0 refills | Status: AC

## 2021-01-19 NOTE — Progress Notes (Signed)
[facility] - Clinic Note  01/19/2021     CHIEF COMPLAINT Patient presents for Retina Evaluation   HISTORY OF PRESENT ILLNESS: Daniel [name] is a 30 y.o. male who presents to the clinic today for:   HPI    Retina Evaluation    In left eye.  This started months ago.  Duration of months.  Associated Symptoms Floaters.  Context:  distance vision, mid-range vision and near vision.  Treatments tried include artificial tears.  Response to treatment was no improvement.  I, the attending physician,  performed the HPI with the patient and updated documentation appropriately.          Comments    30 y/o male pt referred by Dr. Alben Spittle for eval of floaters OU/atrophic hole OS periphery.  Pt has been noticing small black floaters OU (moreso OS) for several mos.  VA good OU cc.  Denies pain, FOL.  Systane prn OU.       Last edited by Rennis Chris, MD on 01/19/2021  9:19 AM. (History)    pt is here on the referral of Dr. Alben Spittle, he states he saw Dr. Alben Spittle last Thursday bc he has had persistent floaters for the past 1.5-2 years, he states they are worse in brightly lit conditions and move around when he moves his eyes, he says the floaters are mostly in the left eye, but he occasionally sees them in the right eye as well, pt states he takes medication for cholesterol 3x/week  Referring physician: Dimitri Ped, MD [address] [place],  [place] [address]  HISTORICAL INFORMATION:   Selected notes from the MEDICAL RECORD NUMBER Referred by Dr. Baker Pierini for concern of retinal break LEE:  Ocular Hx- PMH-    CURRENT MEDICATIONS: Current Outpatient Medications (Ophthalmic Drugs)  Medication Sig  . prednisoLONE acetate (PRED FORTE) 1 % ophthalmic suspension Place 1 drop into the right eye 4 (four) times daily for 7 days.   No current facility-administered medications for this visit. (Ophthalmic Drugs)   Current Outpatient Medications (Other)   Medication Sig  . Acetaminophen-Codeine 300-30 MG tablet Take by mouth.  Marland Kitchen alprazolam (XANAX) 2 MG tablet Take by mouth.  . calcium carbonate (TUMS EX) 750 MG chewable tablet Chew 2 tablets by mouth 3 (three) times daily as needed for heartburn (acid reflux).  . carisoprodol (SOMA) 350 MG tablet Take 1 tablet (350 mg total) by mouth 3 (three) times daily as needed for muscle spasms.  . cloNIDine (CATAPRES) 0.1 MG tablet Take by mouth.  . docusate sodium (COLACE) 100 MG capsule Take 1 capsule (100 mg total) by mouth 2 (two) times daily.  . mirtazapine (REMERON) 30 MG tablet Take by mouth.  Marland Kitchen omeprazole (PRILOSEC) 40 MG capsule Take 1 capsule by mouth daily.  . OxyCODONE (OXYCONTIN) 15 mg T12A 12 hr tablet Take 1 tablet (15 mg total) by mouth every 12 (twelve) hours.  Marland Kitchen oxyCODONE (ROXICODONE) 15 MG immediate release tablet Take 1 tablet (15 mg total) by mouth every 4 (four) hours as needed for severe pain or breakthrough pain.  . polyethylene glycol (MIRALAX / GLYCOLAX) packet Take 17 g by mouth daily.  . QUEtiapine (SEROQUEL) 400 MG tablet Take 400 mg by mouth at bedtime.  . rosuvastatin (CRESTOR) 40 MG tablet 1 tablet by mouth 3 days per week  . temazepam (RESTORIL) 15 MG capsule Take by mouth.  . zaleplon (SONATA) 10 MG capsule Take by mouth.   No current  facility-administered medications for this visit. (Other)      REVIEW OF SYSTEMS: ROS    Positive for: Gastrointestinal, Neurological, Musculoskeletal, Eyes   Negative for: Constitutional, Skin, Genitourinary, HENT, Endocrine, Cardiovascular, Respiratory, Psychiatric, Allergic/Imm, Heme/Lymph   Last edited by Celine Mans, COA on 01/19/2021  9:15 AM. (History)       ALLERGIES Allergies  Allergen Reactions  . Hydrocodone-Acetaminophen Shortness Of Breath and Itching    Oxycodone does not cause itching  . Fentanyl Swelling    pt states that fentanyl has not bothered him in surgeries but patches have caused arms and legs to  swell.     PAST MEDICAL HISTORY Past Medical History:  Diagnosis Date  . Anxiety    panic attacks  . Chronic back pain   . Fractures    L2   L3   . GERD (gastroesophageal reflux disease)   . Headache   . History of bronchitis   . History of pneumonia   . Migraines   . Panic attacks   . Shortness of breath dyspnea    with severe pain   Past Surgical History:  Procedure Laterality Date  . BACK SURGERY     x2   . KYPHOPLASTY N/A 08/07/2014   Procedure: L1 - L4 INSTRUMENTED FUSION AND L2 KYPHOPLASTY;  Surgeon: Venita Lick, MD;  Location: [facility] OR;  Service: Orthopedics;  Laterality: N/A;  . REVISION OF PEDICLE SCREW LOWER BACK  09/25/2014   DR BROOKS  . TONSILLECTOMY    . TYMPANOSTOMY TUBE PLACEMENT      FAMILY HISTORY Family History  Problem Relation Age of Onset  . Hyperlipidemia Father   . Depression Mother   . Glaucoma Mother   . Glaucoma Maternal Grandfather   . Alcohol abuse Neg Hx   . Anxiety disorder Neg Hx   . Drug abuse Neg Hx     SOCIAL HISTORY Social History   Tobacco Use  . Smoking status: Current Every Day Smoker    Packs/day: 0.50    Years: 10.00    Pack years: 5.00    Types: Cigarettes  . Smokeless tobacco: Current User    Types: Snuff  . Tobacco comment: rarely  Substance Use Topics  . Alcohol use: Yes    Comment: pt. states once of month  . Drug use: No         OPHTHALMIC EXAM:  Base Eye Exam    Visual Acuity (Snellen - Linear)      Right Left   Dist cc 20/20 20/20   Correction: Glasses       Tonometry (Tonopen, 9:19 AM)      Right Left   Pressure 18 19       Pupils      Dark Light Shape React APD   Right 3 2 Round Brisk None   Left 3 2 Round Brisk None       Visual Fields (Counting fingers)      Left Right    Full Full       Extraocular Movement      Right Left    Full, Ortho Full, Ortho       Neuro/Psych    Oriented x3: Yes   Mood/Affect: Normal       Dilation    Both eyes: 1.0% Mydriacyl, 2.5%  Phenylephrine @ 9:19 AM        Slit Lamp and Fundus Exam    Slit Lamp Exam      Right Left  Lids/Lashes Normal Normal   Conjunctiva/Sclera White and quiet White and quiet   Cornea Clear Clear   Anterior Chamber Deep and quiet Deep and quiet   Iris Round and dilated Round and dilated   Lens Clear Clear   Vitreous Vitreous syneresis Vitreous syneresis       Fundus Exam      Right Left   Disc Pink and Sharp, mild tilt, temporal PPA Pink and Sharp, mild tilt   C/D Ratio 0.4 0.4   Macula Flat, Good foveal reflex, RPE mottling, No heme or edema Flat, Good foveal reflex, RPE mottling, No heme or edema   Vessels Normal Normal   Periphery Attached, focal pigment clumping at 0700, focal pigmented VR tufts at 1015 and 1030 Attached, focal pigmented CR scar at 0330, pigmented cystoid degeneration        Refraction    Wearing Rx      Sphere Cylinder Axis   Right -2.00 +0.50 180   Left -2.50 +1.00 145   Age: [age]yr   Type: SVL       Manifest Refraction      Sphere Cylinder Axis Dist VA   Right -2.25 +0.50 180 20/20+   Left -2.75 +0.75 150 20/20          IMAGING AND PROCEDURES  Imaging and Procedures for 01/19/2021  OCT, Retina - OU - Both Eyes       Right Eye Quality was good. Central Foveal Thickness: 313. Progression has no prior data. Findings include normal foveal contour, no IRF, no SRF, vitreomacular adhesion .   Left Eye Quality was good. Central Foveal Thickness: 315. Progression has no prior data. Findings include normal foveal contour, no IRF, no SRF, vitreomacular adhesion  (+vitreous opacities).   Notes *Images captured and stored on drive  Diagnosis / Impression:  NFP, no IRF/SRF OU +VMA OU  Clinical management:  See below  Abbreviations: NFP - Normal foveal profile. CME - cystoid macular edema. PED - pigment epithelial detachment. IRF - intraretinal fluid. SRF - subretinal fluid. EZ - ellipsoid zone. ERM - epiretinal membrane. ORA - outer retinal  atrophy. ORT - outer retinal tubulation. SRHM - subretinal hyper-reflective material. IRHM - intraretinal hyper-reflective material        Repair Retinal Breaks, Laser - OD - Right Eye       LASER PROCEDURE NOTE  Procedure:  Barrier laser retinopexy using slit lamp laser, right eye   Diagnosis:   Vitreoretinal tufts / retinal defects, right eye                     Pigmented VR tufts at 1015, 1030                         Pigmented CR scar at 0700   Surgeon: Rennis ChrisBrian Frank Novelo, MD, PhD  Anesthesia: Topical  Informed consent obtained, operative eye marked, and time out performed prior to initiation of laser.   Laser settings:  Lumenis [id number] laser, slit lamp Lens: Mainster PRP 165 Power: 260 mW Spot size: 200 microns Duration: 30 msec  # spots: 407  Placement of laser: Using a Mainster PRP 165 contact lens at the slit lamp, laser was placed in three confluent rows around VR tufts at 1015 and 1030 and pigment CR scar at 0700, anterior to equator with additional rows anteriorly.  Complications: None.  Patient tolerated the procedure well and received written and verbal post-procedure care information/education.  ASSESSMENT/PLAN:    ICD-10-CM   1. 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 Right retinal defect  H33.301 Repair Retinal Breaks, Laser - OD - Right Eye  3. Retinal edema  H35.81 OCT, Retina - OU - Both Eyes    1-3. Vitreoretinal tufts / retinal defect OD  - focal pigmented VR tufts at 1015 and 1030, focal pigmented CR scar at 0700  - discussed findings, prognosis and potential treatment options  - recommend laser retinopexy OD today, 03.21.22  - pt wishes to proceed  - RBA of procedure discussed, questions answered  - informed consent obtained and signed  - see procedure note  - start PF QID OD x7 days  - f/u 2-3 weeks -- POV, DFE, OCT  Ophthalmic Meds Ordered this visit:  Meds ordered this  encounter  Medications  . prednisoLONE acetate (PRED FORTE) 1 % ophthalmic suspension    Sig: Place 1 drop into the right eye 4 (four) times daily for 7 days.    Dispense:  10 mL    Refill:  0      Return for f/u 2-3 weeks, VR tufts OD, DFE, OCT.  There are no Patient Instructions on file for this visit.   Explained the diagnoses, plan, and follow up with the patient and they expressed understanding.  Patient expressed understanding of the importance of proper follow up care.   This document serves as a record of services personally performed by Karie Chimera, MD, PhD. It was created on their behalf by Glee Arvin. Manson Passey, OA an ophthalmic technician. The creation of this record is the provider's dictation and/or activities during the visit.    Electronically signed by: Glee Arvin. Manson Passey, [place] 03.21.2022 12:01 PM   Karie Chimera, M.D., Ph.D. Diseases & Surgery of the Retina and Vitreous [facility]  I have reviewed the above documentation for accuracy and completeness, and I agree with the above. Karie Chimera, M.D., Ph.D. 01/19/21 12:01 PM  Abbreviations: M myopia (nearsighted); A astigmatism; H hyperopia (farsighted); P presbyopia; Mrx spectacle prescription;  CTL contact lenses; OD right eye; OS left eye; OU both eyes  XT exotropia; ET esotropia; PEK punctate epithelial keratitis; PEE punctate epithelial erosions; DES dry eye syndrome; MGD meibomian gland dysfunction; ATs artificial tears; PFAT's preservative free artificial tears; NSC nuclear sclerotic cataract; PSC posterior subcapsular cataract; ERM epi-retinal membrane; PVD posterior vitreous detachment; RD retinal detachment; DM diabetes mellitus; DR diabetic retinopathy; NPDR non-proliferative diabetic retinopathy; PDR proliferative diabetic retinopathy; CSME clinically significant macular edema; DME diabetic macular edema; dbh dot blot hemorrhages; CWS cotton wool spot; POAG primary open angle glaucoma; C/D  cup-to-disc ratio; HVF humphrey visual field; GVF goldmann visual field; OCT optical coherence tomography; IOP intraocular pressure; BRVO Branch retinal vein occlusion; CRVO central retinal vein occlusion; CRAO central retinal artery occlusion; BRAO branch retinal artery occlusion; RT retinal tear; SB scleral buckle; PPV pars plana vitrectomy; VH Vitreous hemorrhage; PRP panretinal laser photocoagulation; IVK intravitreal kenalog; VMT vitreomacular traction; MH Macular hole;  NVD neovascularization of the disc; NVE neovascularization elsewhere; AREDS age related eye disease study; ARMD age related macular degeneration; POAG primary open angle glaucoma; EBMD epithelial/anterior basement membrane dystrophy; ACIOL anterior chamber intraocular lens; IOL intraocular lens; PCIOL posterior chamber intraocular lens; Phaco/IOL phacoemulsification with intraocular lens placement; PRK photorefractive keratectomy; LASIK laser assisted in situ keratomileusis; HTN hypertension; DM diabetes mellitus; COPD chronic obstructive pulmonary disease

## 2021-01-30 NOTE — Progress Notes (Signed)
[facility] - Clinic Note  02/03/2021     CHIEF COMPLAINT Patient presents for Retina Follow Up   HISTORY OF PRESENT ILLNESS: Daniel [name] is a 30 y.o. male who presents to the clinic today for:   HPI    Retina Follow Up    Patient presents with  Other.  In right eye.  This started 2 weeks ago.  I, the attending physician,  performed the HPI with the patient and updated documentation appropriately.          Comments    Pt here for 2-3wk f/u VR tufts OD. Pt states vision is about the same, no major changes or issues reported. No eye pain reported.        Last edited by Rennis Chris, MD on 02/04/2021  8:40 AM. (History)      Referring physician: Dimitri Ped, MD [address] [place],  [place] [address]  HISTORICAL INFORMATION:   Selected notes from the MEDICAL RECORD NUMBER Referred by Dr. Baker Pierini for concern of retinal break   CURRENT MEDICATIONS: No current outpatient medications on file. (Ophthalmic Drugs)   No current facility-administered medications for this visit. (Ophthalmic Drugs)   Current Outpatient Medications (Other)  Medication Sig  . Acetaminophen-Codeine 300-30 MG tablet Take by mouth.  Marland Kitchen alprazolam (XANAX) 2 MG tablet Take by mouth.  . calcium carbonate (TUMS EX) 750 MG chewable tablet Chew 2 tablets by mouth 3 (three) times daily as needed for heartburn (acid reflux).  . carisoprodol (SOMA) 350 MG tablet Take 1 tablet (350 mg total) by mouth 3 (three) times daily as needed for muscle spasms.  . cloNIDine (CATAPRES) 0.1 MG tablet Take by mouth.  . docusate sodium (COLACE) 100 MG capsule Take 1 capsule (100 mg total) by mouth 2 (two) times daily.  . mirtazapine (REMERON) 30 MG tablet Take by mouth.  Marland Kitchen omeprazole (PRILOSEC) 40 MG capsule Take 1 capsule by mouth daily.  . OxyCODONE (OXYCONTIN) 15 mg T12A 12 hr tablet Take 1 tablet (15 mg total) by mouth every 12 (twelve) hours.  Marland Kitchen oxyCODONE (ROXICODONE) 15 MG  immediate release tablet Take 1 tablet (15 mg total) by mouth every 4 (four) hours as needed for severe pain or breakthrough pain.  . polyethylene glycol (MIRALAX / GLYCOLAX) packet Take 17 g by mouth daily.  . QUEtiapine (SEROQUEL) 400 MG tablet Take 400 mg by mouth at bedtime.  . rosuvastatin (CRESTOR) 40 MG tablet 1 tablet by mouth 3 days per week  . temazepam (RESTORIL) 15 MG capsule Take by mouth.  . zaleplon (SONATA) 10 MG capsule Take by mouth.   No current facility-administered medications for this visit. (Other)      REVIEW OF SYSTEMS: ROS    Positive for: Gastrointestinal, Neurological, Musculoskeletal, Eyes   Negative for: Constitutional, Skin, Genitourinary, HENT, Endocrine, Cardiovascular, Respiratory, Psychiatric, Allergic/Imm, Heme/Lymph   Last edited by Thompson Grayer, COT on 02/03/2021  9:39 AM. (History)       ALLERGIES Allergies  Allergen Reactions  . Hydrocodone-Acetaminophen Shortness Of Breath and Itching    Oxycodone does not cause itching  . Fentanyl Swelling    pt states that fentanyl has not bothered him in surgeries but patches have caused arms and legs to swell.     PAST MEDICAL HISTORY Past Medical History:  Diagnosis Date  . Anxiety    panic attacks  . Chronic back pain   . Fractures    L2  L3   . GERD (gastroesophageal reflux disease)   . Headache   . History of bronchitis   . History of pneumonia   . Migraines   . Panic attacks   . Shortness of breath dyspnea    with severe pain   Past Surgical History:  Procedure Laterality Date  . BACK SURGERY     x2   . KYPHOPLASTY N/A 08/07/2014   Procedure: L1 - L4 INSTRUMENTED FUSION AND L2 KYPHOPLASTY;  Surgeon: Venita Lickahari Brooks, MD;  Location: [facility] OR;  Service: Orthopedics;  Laterality: N/A;  . REVISION OF PEDICLE SCREW LOWER BACK  09/25/2014   DR BROOKS  . TONSILLECTOMY    . TYMPANOSTOMY TUBE PLACEMENT      FAMILY HISTORY Family History  Problem Relation Age of Onset  .  Hyperlipidemia Father   . Depression Mother   . Glaucoma Mother   . Glaucoma Maternal Grandfather   . Alcohol abuse Neg Hx   . Anxiety disorder Neg Hx   . Drug abuse Neg Hx     SOCIAL HISTORY Social History   Tobacco Use  . Smoking status: Current Every Day Smoker    Packs/day: 0.50    Years: 10.00    Pack years: 5.00    Types: Cigarettes  . Smokeless tobacco: Current User    Types: Snuff  . Tobacco comment: rarely  Substance Use Topics  . Alcohol use: Yes    Comment: pt. states once of month  . Drug use: No         OPHTHALMIC EXAM:  Base Eye Exam    Visual Acuity (Snellen - Linear)      Right Left   Dist cc 20/20 -1 20/20 -2   Correction: Glasses       Tonometry (Tonopen, 9:44 AM)      Right Left   Pressure 21 19       Pupils      Dark Light Shape React APD   Right 3 2 Round Brisk None   Left 3 2 Round Brisk None       Visual Fields (Counting fingers)      Left Right    Full Full       Extraocular Movement      Right Left    Full, Ortho Full, Ortho       Neuro/Psych    Oriented x3: Yes   Mood/Affect: Normal       Dilation    Both eyes: 1.0% Mydriacyl, 2.5% Phenylephrine @ 9:41 AM        Slit Lamp and Fundus Exam    Slit Lamp Exam      Right Left   Lids/Lashes Normal Normal   Conjunctiva/Sclera White and quiet White and quiet   Cornea Clear Clear   Anterior Chamber Deep and quiet Deep and quiet   Iris Round and dilated Round and dilated   Lens Clear Clear   Vitreous Vitreous syneresis Vitreous syneresis       Fundus Exam      Right Left   Disc Pink and Sharp, mild tilt, temporal PPA Pink and Sharp, mild tilt   C/D Ratio 0.4 0.4   Macula Flat, Good foveal reflex, RPE mottling, No heme or edema, flat, pigmented choroidal lesion inferotemporal macula  Flat, Good foveal reflex, RPE mottling, No heme or edema   Vessels Normal Normal   Periphery Attached, focal pigment clumping at 0700, focal pigmented VR tufts at 1015 and 1030 with  good laser  surrounding all lesions, no new RT/RD Attached, focal pigmented CR scar at 0330, pigmented cystoid degeneration        Refraction    Wearing Rx      Sphere Cylinder Axis   Right -2.00 +0.50 180   Left -2.50 +1.00 145   Type: SVL          IMAGING AND PROCEDURES  Imaging and Procedures for 02/03/2021  OCT, Retina - OU - Both Eyes       Right Eye Quality was good. Central Foveal Thickness: 315. Progression has been stable. Findings include normal foveal contour, no IRF, no SRF, vitreomacular adhesion .   Left Eye Quality was good. Central Foveal Thickness: 309. Progression has improved. Findings include normal foveal contour, no IRF, no SRF, vitreomacular adhesion  (Mild improvement in vitreous opacities).   Notes *Images captured and stored on drive  Diagnosis / Impression:  NFP, no IRF/SRF OU +VMA OU  Clinical management:  See below  Abbreviations: NFP - Normal foveal profile. CME - cystoid macular edema. PED - pigment epithelial detachment. IRF - intraretinal fluid. SRF - subretinal fluid. EZ - ellipsoid zone. ERM - epiretinal membrane. ORA - outer retinal atrophy. ORT - outer retinal tubulation. SRHM - subretinal hyper-reflective material. IRHM - intraretinal hyper-reflective material               ASSESSMENT/PLAN:    ICD-10-CM   1. Vitreoretinal tuft of right eye  Q14.1   2. Right retinal defect  H33.301   3. Retinal edema  H35.81 OCT, Retina - OU - Both Eyes    1-3. Vitreoretinal tufts / retinal defect OD  - focal pigmented VR tufts at 1015 and 1030, focal pigmented CR scar at 0700  - s/p laser retinopexy OD 03.21.22 -- good laser changes in place  - f/u 3-4 months  Ophthalmic Meds Ordered this visit:  No orders of the defined types were placed in this encounter.     Return 3-4 months, for DFE, OCT.  There are no Patient Instructions on file for this visit.   Explained the diagnoses, plan, and follow up with the patient and they  expressed understanding.  Patient expressed understanding of the importance of proper follow up care.   This document serves as a record of services personally performed by Karie Chimera, MD, PhD. It was created on their behalf by Cristopher Estimable, COT an ophthalmic technician. The creation of this record is the provider's dictation and/or activities during the visit.    Electronically signed by: Cristopher Estimable, COT 4.1.22 @ 9:20 AM   This document serves as a record of services personally performed by Karie Chimera, MD, PhD. It was created on their behalf by Cristopher Estimable, COT an ophthalmic technician. The creation of this record is the provider's dictation and/or activities during the visit.    Electronically signed by: Cristopher Estimable, COT 4.5.22 @ 9:20 AM   This document serves as a record of services personally performed by Karie Chimera, MD, PhD. It was created on their behalf by Annalee Genta, COMT. The creation of this record is the provider's dictation and/or activities during the visit.  Electronically signed by: Annalee Genta, COMT 02/04/21 9:20 AM  Karie Chimera, M.D., Ph.D. Diseases & Surgery of the Retina and Vitreous [facility] 02/03/2021   I have reviewed the above documentation for accuracy and completeness, and I agree with the above. Karie Chimera, M.D., Ph.D. 02/04/21 9:20 AM  Abbreviations: M myopia (nearsighted); A astigmatism; H hyperopia (farsighted); P presbyopia; Mrx spectacle prescription;  CTL contact lenses; OD right eye; OS left eye; OU both eyes  XT exotropia; ET esotropia; PEK punctate epithelial keratitis; PEE punctate epithelial erosions; DES dry eye syndrome; MGD meibomian gland dysfunction; ATs artificial tears; PFAT's preservative free artificial tears; NSC nuclear sclerotic cataract; PSC posterior subcapsular cataract; ERM epi-retinal membrane; PVD posterior vitreous detachment; RD retinal detachment; DM diabetes mellitus; DR  diabetic retinopathy; NPDR non-proliferative diabetic retinopathy; PDR proliferative diabetic retinopathy; CSME clinically significant macular edema; DME diabetic macular edema; dbh dot blot hemorrhages; CWS cotton wool spot; POAG primary open angle glaucoma; C/D cup-to-disc ratio; HVF humphrey visual field; GVF goldmann visual field; OCT optical coherence tomography; IOP intraocular pressure; BRVO Branch retinal vein occlusion; CRVO central retinal vein occlusion; CRAO central retinal artery occlusion; BRAO branch retinal artery occlusion; RT retinal tear; SB scleral buckle; PPV pars plana vitrectomy; VH Vitreous hemorrhage; PRP panretinal laser photocoagulation; IVK intravitreal kenalog; VMT vitreomacular traction; MH Macular hole;  NVD neovascularization of the disc; NVE neovascularization elsewhere; AREDS age related eye disease study; ARMD age related macular degeneration; POAG primary open angle glaucoma; EBMD epithelial/anterior basement membrane dystrophy; ACIOL anterior chamber intraocular lens; IOL intraocular lens; PCIOL posterior chamber intraocular lens; Phaco/IOL phacoemulsification with intraocular lens placement; PRK photorefractive keratectomy; LASIK laser assisted in situ keratomileusis; HTN hypertension; DM diabetes mellitus; COPD chronic obstructive pulmonary disease

## 2021-02-03 ENCOUNTER — Ambulatory Visit (INDEPENDENT_AMBULATORY_CARE_PROVIDER_SITE_OTHER): Payer: Medicare Other | Admitting: Ophthalmology

## 2021-02-03 ENCOUNTER — Encounter (INDEPENDENT_AMBULATORY_CARE_PROVIDER_SITE_OTHER): Payer: Self-pay | Admitting: Ophthalmology

## 2021-02-03 ENCOUNTER — Other Ambulatory Visit: Payer: Self-pay

## 2021-02-03 DIAGNOSIS — H3581 Retinal edema: Secondary | ICD-10-CM | POA: Diagnosis not present

## 2021-02-03 DIAGNOSIS — H33301 Unspecified retinal break, right eye: Secondary | ICD-10-CM | POA: Diagnosis not present

## 2021-02-03 DIAGNOSIS — Q141 Congenital malformation of retina: Secondary | ICD-10-CM | POA: Diagnosis not present

## 2021-02-04 ENCOUNTER — Encounter (INDEPENDENT_AMBULATORY_CARE_PROVIDER_SITE_OTHER): Payer: Self-pay | Admitting: Ophthalmology

## 2021-05-18 ENCOUNTER — Telehealth (INDEPENDENT_AMBULATORY_CARE_PROVIDER_SITE_OTHER): Payer: Self-pay

## 2021-05-18 NOTE — Progress Notes (Addendum)
[facility] Note  05/19/2021     CHIEF COMPLAINT Patient presents for Retina Follow Up   HISTORY OF PRESENT ILLNESS: Daniel [name] is a 30 y.o. male who presents to the clinic today for:   HPI     Retina Follow Up   Patient presents with  Other.  Duration of 3 months.  Since onset it is stable.  I, the attending physician,  performed the HPI with the patient and updated documentation appropriately.        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 Pt states no vision changes or concerns. No changes noted.       Last edited by Bernarda Caffey, MD on 05/19/2021 11:44 AM.    Pt states no change in vision, no new fol or floaters   Referring physician: Hortencia Pilar, MD [place],  [place] [id number]  HISTORICAL INFORMATION:   Selected notes from the MEDICAL RECORD NUMBER Referred by Dr. Quentin Ore for concern of retinal break   CURRENT MEDICATIONS: No current outpatient medications on file. (Ophthalmic Drugs)   No current facility-administered medications for this visit. (Ophthalmic Drugs)   Current Outpatient Medications (Other)  Medication Sig   Acetaminophen-Codeine 300-30 MG tablet Take by mouth.   alprazolam (XANAX) 2 MG tablet Take by mouth.   calcium carbonate (TUMS EX) 750 MG chewable tablet Chew 2 tablets by mouth 3 (three) times daily as needed for heartburn (acid reflux).   carisoprodol (SOMA) 350 MG tablet Take 1 tablet (350 mg total) by mouth 3 (three) times daily as needed for muscle spasms.   cloNIDine (CATAPRES) 0.1 MG tablet Take by mouth.   docusate sodium (COLACE) 100 MG capsule Take 1 capsule (100 mg total) by mouth 2 (two) times daily.   mirtazapine (REMERON) 30 MG tablet Take by mouth.   omeprazole (PRILOSEC) 40 MG capsule Take 1 capsule by mouth daily.   OxyCODONE (OXYCONTIN) 15 mg T12A 12 hr tablet Take 1 tablet (15 mg total) by mouth every 12 (twelve) hours.    oxyCODONE (ROXICODONE) 15 MG immediate release tablet Take 1 tablet (15 mg total) by mouth every 4 (four) hours as needed for severe pain or breakthrough pain.   polyethylene glycol (MIRALAX / GLYCOLAX) packet Take 17 g by mouth daily.   QUEtiapine (SEROQUEL) 400 MG tablet Take 400 mg by mouth at bedtime.   rosuvastatin (CRESTOR) 40 MG tablet 1 tablet by mouth 3 days per week   zaleplon (SONATA) 10 MG capsule Take by mouth.   No current facility-administered medications for this visit. (Other)      REVIEW OF SYSTEMS: ROS   Positive for: Gastrointestinal, Neurological, Musculoskeletal, Eyes Negative for: Constitutional, Skin, Genitourinary, HENT, Endocrine, Cardiovascular, Respiratory, Psychiatric, Allergic/Imm, Heme/Lymph Last edited by Kingsley Spittle, COT on 05/19/2021  9:30 AM.        ALLERGIES Allergies  Allergen Reactions   Hydrocodone-Acetaminophen Shortness Of Breath and Itching    Oxycodone does not cause itching   Fentanyl Swelling    pt states that fentanyl has not bothered him in surgeries but patches have caused arms and legs to swell.     PAST MEDICAL HISTORY Past Medical History:  Diagnosis Date   Anxiety    panic attacks   Chronic back pain    Fractures    L2   L3    GERD (gastroesophageal reflux disease)    Headache  History of bronchitis    History of pneumonia    Migraines    Panic attacks    Shortness of breath dyspnea    with severe pain   Past Surgical History:  Procedure Laterality Date   BACK SURGERY     x2    KYPHOPLASTY N/A 08/07/2014   Procedure: L1 - L4 INSTRUMENTED FUSION AND L2 KYPHOPLASTY;  Surgeon: Melina Schools, MD;  Location: [facility];  Service: Orthopedics;  Laterality: N/A;   REVISION OF PEDICLE SCREW LOWER BACK  09/25/2014   DR BROOKS   TONSILLECTOMY     TYMPANOSTOMY TUBE PLACEMENT      FAMILY HISTORY Family History  Problem Relation Age of Onset   Hyperlipidemia Father    Depression Mother    Glaucoma Mother     Glaucoma Maternal Grandfather    Alcohol abuse Neg Hx    Anxiety disorder Neg Hx    Drug abuse Neg Hx     SOCIAL HISTORY Social History   Tobacco Use   Smoking status: Every Day    Packs/day: 0.50    Years: 10.00    Pack years: 5.00    Types: Cigarettes   Smokeless tobacco: Current    Types: Snuff   Tobacco comments:    rarely  Substance Use Topics   Alcohol use: Yes    Comment: pt. states once of month   Drug use: No         OPHTHALMIC EXAM:  Base Eye Exam     Visual Acuity (Snellen - Linear)       Right Left   Dist cc 20/20 -1 20/20    Correction: Glasses         Tonometry (Tonopen, 9:37 AM)       Right Left   Pressure 18 14         Pupils       Dark Light Shape React APD   Right 3 2 Round Brisk None   Left 3 2 Round Brisk None         Visual Fields (Counting fingers)       Left Right    Full Full         Extraocular Movement       Right Left    Full, Ortho Full, Ortho         Neuro/Psych     Oriented x3: Yes   Mood/Affect: Normal         Dilation     Both eyes: 1.0% Mydriacyl, 2.5% Phenylephrine @ 9:38 AM           Slit Lamp and Fundus Exam     Slit Lamp Exam       Right Left   Lids/Lashes Normal Normal   Conjunctiva/Sclera White and quiet White and quiet   Cornea Clear Clear   Anterior Chamber Deep and quiet Deep and quiet   Iris Round and dilated Round and dilated   Lens Clear Clear   Vitreous Vitreous syneresis Vitreous syneresis         Fundus Exam       Right Left   Disc Pink and Sharp, mild tilt, temporal PPA Pink and Sharp, mild tilt   C/D Ratio 0.4 0.4   Macula Flat, Good foveal reflex, RPE mottling, No heme or edema, flat, pigmented choroidal lesion inferotemporal macula  Flat, Good foveal reflex, RPE mottling, No heme or edema   Vessels Normal Normal   Periphery Attached, focal pigment clumping at  0700, focal pigmented VR tufts at 1015 and 1030; good laser surrounding all lesions, no new  RT/RD/lattice Attached, focal pigmented CR scar at 0330, pigmented cystoid degeneration, no new RT/RD/lattice           Refraction     Wearing Rx       Sphere Cylinder Axis   Right -2.00 +0.50 180   Left -2.50 +1.00 145    Type: SVL            IMAGING AND PROCEDURES  Imaging and Procedures for 05/19/2021  OCT, Retina - OU - Both Eyes       Right Eye Quality was good. Central Foveal Thickness: 311. Progression has been stable. Findings include normal foveal contour, no IRF, no SRF, vitreomacular adhesion .   Left Eye Quality was good. Central Foveal Thickness: 311. Progression has been stable. Findings include normal foveal contour, no IRF, no SRF, vitreomacular adhesion .   Notes *Images captured and stored on drive  Diagnosis / Impression:  NFP, no IRF/SRF OU +VMA OU  Clinical management:  See below  Abbreviations: NFP - Normal foveal profile. CME - cystoid macular edema. PED - pigment epithelial detachment. IRF - intraretinal fluid. SRF - subretinal fluid. EZ - ellipsoid zone. ERM - epiretinal membrane. ORA - outer retinal atrophy. ORT - outer retinal tubulation. SRHM - subretinal hyper-reflective material. IRHM - intraretinal hyper-reflective material             ASSESSMENT/PLAN:    ICD-10-CM   1. 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 Right retinal defect  H33.301     3. Retinal edema  H35.81 OCT, Retina - OU - Both Eyes     1-3. Vitreoretinal tufts / retinal defect OD  - focal pigmented VR tufts at 1015 and 1030, focal pigmented CR scar at 0700  - s/p laser retinopexy OD 03.21.22 -- good laser changes in place  - pt is cleared from a retina standpoint for release to Dr. Kathlen Mody and resumption of primary eye care  - pt can f/u here prn   Ophthalmic Meds Ordered this visit:  No orders of the defined types were placed in this encounter.     Return if symptoms worsen or fail to improve.  There are no Patient Instructions on file for  this visit.   Explained the diagnoses, plan, and follow up with the patient and they expressed understanding.  Patient expressed understanding of the importance of proper follow up care.   This document serves as a record of services personally performed by Gardiner Sleeper, MD, PhD. It was created on their behalf by Estill Bakes, COT an ophthalmic technician. The creation of this record is the provider's dictation and/or activities during the visit.    Electronically signed by: Estill Bakes, COT 7.18.22 @ 11:47 AM   Gardiner Sleeper, M.D., Ph.D. Diseases & Surgery of the Retina and Vitreous [facility]  I have reviewed the above documentation for accuracy and completeness, and I agree with the above. Gardiner Sleeper, M.D., Ph.D. 05/19/21 11:47 AM   Abbreviations: M myopia (nearsighted); A astigmatism; H hyperopia (farsighted); P presbyopia; Mrx spectacle prescription;  CTL contact lenses; OD right eye; OS left eye; OU both eyes  XT exotropia; ET esotropia; PEK punctate epithelial keratitis; PEE punctate epithelial erosions; DES dry eye syndrome; MGD meibomian gland dysfunction; ATs artificial tears; PFAT's preservative free artificial tears; [place] nuclear sclerotic cataract; PSC posterior subcapsular cataract;  ERM epi-retinal membrane; PVD posterior vitreous detachment; RD retinal detachment; DM diabetes mellitus; DR diabetic retinopathy; NPDR non-proliferative diabetic retinopathy; PDR proliferative diabetic retinopathy; CSME clinically significant macular edema; DME diabetic macular edema; dbh dot blot hemorrhages; CWS cotton wool spot; POAG primary open angle glaucoma; C/D cup-to-disc ratio; HVF humphrey visual field; GVF goldmann visual field; OCT optical coherence tomography; IOP intraocular pressure; BRVO Branch retinal vein occlusion; CRVO central retinal vein occlusion; CRAO central retinal artery occlusion; BRAO branch retinal artery occlusion; RT retinal tear; SB  scleral buckle; PPV pars plana vitrectomy; VH Vitreous hemorrhage; PRP panretinal laser photocoagulation; IVK intravitreal kenalog; VMT vitreomacular traction; MH Macular hole;  NVD neovascularization of the disc; NVE neovascularization elsewhere; AREDS age related eye disease study; ARMD age related macular degeneration; POAG primary open angle glaucoma; EBMD epithelial/anterior basement membrane dystrophy; ACIOL anterior chamber intraocular lens; IOL intraocular lens; PCIOL posterior chamber intraocular lens; Phaco/IOL phacoemulsification with intraocular lens placement; [place] photorefractive keratectomy; LASIK laser assisted in situ keratomileusis; HTN hypertension; DM diabetes mellitus; COPD chronic obstructive pulmonary disease

## 2021-05-19 ENCOUNTER — Encounter (INDEPENDENT_AMBULATORY_CARE_PROVIDER_SITE_OTHER): Payer: Self-pay | Admitting: Ophthalmology

## 2021-05-19 ENCOUNTER — Other Ambulatory Visit: Payer: Self-pay

## 2021-05-19 ENCOUNTER — Ambulatory Visit (INDEPENDENT_AMBULATORY_CARE_PROVIDER_SITE_OTHER): Payer: Medicare Other | Admitting: Ophthalmology

## 2021-05-19 DIAGNOSIS — H3581 Retinal edema: Secondary | ICD-10-CM | POA: Diagnosis not present

## 2021-05-19 DIAGNOSIS — H33301 Unspecified retinal break, right eye: Secondary | ICD-10-CM

## 2021-05-19 DIAGNOSIS — Q141 Congenital malformation of retina: Secondary | ICD-10-CM
